# Patient Record
Sex: Female | Born: 1985
Health system: Southern US, Community
[De-identification: ages and names within clinical notes are randomized; demographics above are authoritative.]

## PROBLEM LIST (undated history)

## (undated) DIAGNOSIS — D649 Anemia, unspecified: Secondary | ICD-10-CM

## (undated) DIAGNOSIS — K219 Gastro-esophageal reflux disease without esophagitis: Secondary | ICD-10-CM

## (undated) DIAGNOSIS — I10 Essential (primary) hypertension: Secondary | ICD-10-CM

## (undated) HISTORY — DX: Essential (primary) hypertension: I10

## (undated) HISTORY — PX: NO PAST SURGERIES: SHX2092

---

## 2013-08-16 ENCOUNTER — Encounter (INDEPENDENT_AMBULATORY_CARE_PROVIDER_SITE_OTHER): Payer: Self-pay

## 2013-08-16 ENCOUNTER — Ambulatory Visit (INDEPENDENT_AMBULATORY_CARE_PROVIDER_SITE_OTHER): Payer: Medicaid Other | Admitting: Family Medicine

## 2013-08-16 ENCOUNTER — Encounter: Payer: Self-pay | Admitting: Family Medicine

## 2013-08-16 ENCOUNTER — Ambulatory Visit (INDEPENDENT_AMBULATORY_CARE_PROVIDER_SITE_OTHER): Payer: Medicaid Other

## 2013-08-16 VITALS — BP 100/69 | HR 71 | Ht 64.0 in | Wt 122.0 lb

## 2013-08-16 DIAGNOSIS — M25519 Pain in unspecified shoulder: Secondary | ICD-10-CM

## 2013-08-16 DIAGNOSIS — M25511 Pain in right shoulder: Secondary | ICD-10-CM

## 2013-08-16 MED ORDER — MELOXICAM 15 MG PO TABS: 15.0000 mg | ORAL_TABLET | Freq: Every day | ORAL | Status: DC

## 2013-08-16 NOTE — Progress Notes (Signed)
   Subjective:    Patient ID: Joyce Rasmussen, female    DOB: 1985/09/29, 29 y.o.   MRN: [id number]  HPI  Shoulder Pain:  Affected Shoulder:R shoulder  Duration: months  Mechanism of injury: unsure  Traumatic Etiology?:denies  Pain Location: anterolateral shoulder  Numbness or paresthesias: no Weakness:no Decreased grip strength?:no Aggravating movements: overhead movement  Alleviating factors: rest  Prior history of shoulder trauma/injury:unsure. Pain started about 1 year ago and self resolved.      Review of Systems  All other systems reviewed and are negative.       Objective:   Physical Exam  Constitutional: She is oriented to person, place, and time. She appears well-developed and well-nourished.  HENT:  Head: Normocephalic and atraumatic.  Eyes: Conjunctivae are normal. Pupils are equal, round, and reactive to light.  Neck: Normal range of motion.  Cardiovascular: Normal rate and regular rhythm.   Pulmonary/Chest: Effort normal and breath sounds normal.  Abdominal: Soft.  Musculoskeletal:  + mild TTP across antrerolateral shoulder  No deformity MIld pain with resisted external rotation Empty can +    Neurological: She is alert and oriented to person, place, and time.  Skin: Skin is warm.    [facility] reading (PRIMARY) by  Dr. Alvester Morin  R shoulder xray preliminarily negative for any fracture or dislocation                                         Assessment & Plan:  Pain in joint, shoulder region, right - Plan: meloxicam (MOBIC) 15 MG tablet, DG Shoulder Right  Exam and history consistent with rotator cuff injury Xrays preliminarily negative for any fracture or disloaction RICE and NSAIDs Rotator cuff rehab exercise handout given Discussed general and MSK red flags.  Follow up as needed.

## 2013-08-16 NOTE — Patient Instructions (Signed)
Rotator Cuff Injury The rotator cuff is the collective set of muscles and tendons that make up the stabilizing unit of your shoulder. This unit holds in the ball of the humerus (upper arm bone) in the socket of the scapula (shoulder blade). Injuries to this stabilizing unit most commonly come from sports or activities that cause the arm to be moved repeatedly over the head. Examples of this include throwing, weight lifting, swimming, racquet sports, or an injury such as falling on your arm. Chronic (longstanding) irritation of this unit can cause inflammation (soreness), bursitis, and eventual damage to the tendons to the point of rupture (tear). An acute (sudden) injury of the rotator cuff can result in a partial or complete tear. You may need surgery with complete tears. Small or partial rotator cuff tears may be treated conservatively with temporary immobilization, exercises and rest. Physical therapy may be needed. HOME CARE INSTRUCTIONS   Apply ice to the injury for 15-20 minutes 03-04 times per day for the first 2 days. Put the ice in a plastic bag and place a towel between the bag of ice and your skin.  If you have a shoulder immobilizer (sling and straps), do not remove it for as long as directed by your caregiver or until you see a caregiver for a follow-up examination. If you need to remove it, move your arm as little as possible.  You may want to sleep on several pillows or in a recliner at night to lessen swelling and pain.  Only take over-the-counter or prescription medicines for pain, discomfort, or fever as directed by your caregiver.  Do simple hand squeezing exercises with a soft rubber ball to decrease hand swelling. SEEK MEDICAL CARE IF:   Pain in your shoulder increases or new pain or numbness develops in your arm, hand, or fingers.  Your hand or fingers are colder than your other hand. SEEK IMMEDIATE MEDICAL CARE IF:   Your arm, hand, or fingers are numb or tingling.  Your  arm, hand, or fingers are increasingly swollen and painful, or turn white or blue. Document Released: 08/23/2000 Document Revised: 11/18/2011 Document Reviewed: 04/07/2013 ExitCare Patient Information 2014 [employer].  

## 2014-01-24 ENCOUNTER — Encounter: Payer: Self-pay | Admitting: Physician Assistant

## 2014-01-24 ENCOUNTER — Ambulatory Visit (INDEPENDENT_AMBULATORY_CARE_PROVIDER_SITE_OTHER): Payer: Medicaid Other | Admitting: Physician Assistant

## 2014-01-24 VITALS — BP 109/78 | HR 66 | Temp 99.0°F | Ht 64.0 in | Wt 123.0 lb

## 2014-01-24 DIAGNOSIS — L239 Allergic contact dermatitis, unspecified cause: Secondary | ICD-10-CM

## 2014-01-24 DIAGNOSIS — L259 Unspecified contact dermatitis, unspecified cause: Secondary | ICD-10-CM

## 2014-01-24 NOTE — Patient Instructions (Signed)
Poison Ivy Poison ivy is a inflammation of the skin (contact dermatitis) caused by touching the allergens on the leaves of the ivy plant following previous exposure to the plant. The rash usually appears 48 hours after exposure. The rash is usually bumps (papules) or blisters (vesicles) in a linear pattern. Depending on your own sensitivity, the rash may simply cause redness and itching, or it may also progress to blisters which may break open. These must be well cared for to prevent secondary bacterial (germ) infection, followed by scarring. Keep any open areas dry, clean, dressed, and covered with an antibacterial ointment if needed. The eyes may also get puffy. The puffiness is worst in the morning and gets better as the day progresses. This dermatitis usually heals without scarring, within 2 to 3 weeks without treatment. HOME CARE INSTRUCTIONS  Thoroughly wash with soap and water as soon as you have been exposed to poison ivy. You have about one half hour to remove the plant resin before it will cause the rash. This washing will destroy the oil or antigen on the skin that is causing, or will cause, the rash. Be sure to wash under your fingernails as any plant resin there will continue to spread the rash. Do not rub skin vigorously when washing affected area. Poison ivy cannot spread if no oil from the plant remains on your body. A rash that has progressed to weeping sores will not spread the rash unless you have not washed thoroughly. It is also important to wash any clothes you have been wearing as these may carry active allergens. The rash will return if you wear the unwashed clothing, even several days later. Avoidance of the plant in the future is the best measure. Poison ivy plant can be recognized by the number of leaves. Generally, poison ivy has three leaves with flowering branches on a single stem. Diphenhydramine may be purchased over the counter and used as needed for itching. Do not drive with  this medication if it makes you drowsy.Ask your caregiver about medication for children. SEEK MEDICAL CARE IF:  Open sores develop.  Redness spreads beyond area of rash.  You notice purulent (pus-like) discharge.  You have increased pain.  Other signs of infection develop (such as fever). Document Released: 08/23/2000 Document Revised: 11/18/2011 Document Reviewed: 07/12/2009 ExitCare Patient Information 2014 [employer].  

## 2014-01-24 NOTE — Progress Notes (Signed)
Subjective:     Patient ID: Joyce Rasmussen, female   DOB: 1986-01-08, 28 y.o.   MRN: [id number]  HPI Pt with pruritic rash since Wed She was working in the garden prior to the rash appearing Using OTC cream w/o relief  Review of Systems Denies any pain or drainage from the sites Sx worse when she get hot    Objective:   Physical Exam Diffuse erythema rash to the hands bilat and to the L side of the chin Rash mainly to the prox 3-5th digits bilat No vesicles seen No surrounding erythema or induration seen to the rash Similar rash to the L chin line No surrounding erythema, edema, or induration    Assessment:     Allergic Derm    Plan:     Nl course reviewed Cool compresses OTC Claritin and hydrocort for sx F/U prn

## 2014-02-22 ENCOUNTER — Ambulatory Visit (INDEPENDENT_AMBULATORY_CARE_PROVIDER_SITE_OTHER): Payer: Medicaid Other | Admitting: Nurse Practitioner

## 2014-02-22 ENCOUNTER — Encounter: Payer: Self-pay | Admitting: Nurse Practitioner

## 2014-02-22 VITALS — BP 98/72 | HR 82 | Temp 97.7°F | Ht 63.0 in | Wt 123.0 lb

## 2014-02-22 DIAGNOSIS — H918X9 Other specified hearing loss, unspecified ear: Secondary | ICD-10-CM

## 2014-02-22 DIAGNOSIS — H6120 Impacted cerumen, unspecified ear: Secondary | ICD-10-CM

## 2014-02-22 NOTE — Patient Instructions (Signed)
Cerumen Impaction A cerumen impaction is when the wax in your ear forms a plug. This plug usually causes reduced hearing. Sometimes it also causes an earache or dizziness. Removing a cerumen impaction can be difficult and painful. The wax sticks to the ear canal. The canal is sensitive and bleeds easily. If you try to remove a heavy wax buildup with a cotton tipped swab, you may push it in further. Irrigation with water, suction, and small ear curettes may be used to clear out the wax. If the impaction is fixed to the skin in the ear canal, ear drops may be needed for a few days to loosen the wax. People who build up a lot of wax frequently can use ear wax removal products available in your local drugstore. SEEK MEDICAL CARE IF:  You develop an earache, increased hearing loss, or marked dizziness. Document Released: 10/03/2004 Document Revised: 11/18/2011 Document Reviewed: 11/23/2009 ExitCare Patient Information 2014 [employer].  

## 2014-02-22 NOTE — Progress Notes (Signed)
   Subjective:    Patient ID: Joyce Rasmussen, female    DOB: June 19, 1986, 28 y.o.   MRN: [id number]  HPI Patient in C/o right ear pain and decreased hearing.- Started 2 weeks ago. No drianage    Review of Systems  Constitutional: Negative.   HENT: Positive for ear pain.   Respiratory: Negative.   Cardiovascular: Negative.   Genitourinary: Negative.   Neurological: Negative.   Psychiatric/Behavioral: Negative.   All other systems reviewed and are negative.      Objective:   Physical Exam  Constitutional: She is oriented to person, place, and time. She appears well-developed and well-nourished.  HENT:  Right Ear: Hearing, tympanic membrane and external ear normal.  Left Ear: Hearing, tympanic membrane, external ear and ear canal normal.  Right cerumen impaction   Cardiovascular: Normal rate, regular rhythm and normal heart sounds.   Pulmonary/Chest: Effort normal and breath sounds normal.  Neurological: She is alert and oriented to person, place, and time.  Skin: Skin is warm and dry.  Psychiatric: She has a normal mood and affect. Her behavior is normal. Judgment and thought content normal.   BP 98/72  Pulse 82  Temp(Src) 97.7 F (36.5 C) (Oral)  Ht 5\' 3"  (1.6 m)  Wt 123 lb (55.792 kg)  BMI 21.79 kg/m2  LMP 01/17/2014        Assessment & Plan:   1. Hearing loss secondary to cerumen impaction    Debrox OTC 2-3X a week RTO prn  Mary-Margaret Daphine DeutscherMartin, FNP

## 2014-04-01 ENCOUNTER — Encounter: Payer: Self-pay | Admitting: Family Medicine

## 2014-04-01 ENCOUNTER — Ambulatory Visit (INDEPENDENT_AMBULATORY_CARE_PROVIDER_SITE_OTHER): Payer: Medicaid Other | Admitting: Family Medicine

## 2014-04-01 VITALS — BP 104/71 | HR 67 | Temp 98.9°F | Ht 64.0 in | Wt 123.0 lb

## 2014-04-01 DIAGNOSIS — M25511 Pain in right shoulder: Secondary | ICD-10-CM

## 2014-04-01 DIAGNOSIS — M25519 Pain in unspecified shoulder: Secondary | ICD-10-CM

## 2014-04-01 MED ORDER — CYCLOBENZAPRINE HCL 5 MG PO TABS: 5.0000 mg | ORAL_TABLET | Freq: Three times a day (TID) | ORAL | Status: DC | PRN

## 2014-04-01 MED ORDER — IBUPROFEN 600 MG PO TABS: 600.0000 mg | ORAL_TABLET | Freq: Three times a day (TID) | ORAL | Status: DC | PRN

## 2014-04-01 NOTE — Progress Notes (Signed)
   Subjective:    Patient ID: Joyce Rasmussen, female    DOB: 23-Jan-1986, 28 y.o.   MRN: [id number]  HPI This 28 y.o. female presents for evaluation of right shoulder pain after doing job requiring repetitive circular movement and circumduction movement of right shoulder.  She has had this happen in the past and had to quit working.  She states she feels better in the am but as soon as she starts back to Work she gets pain in her right shoulder..   Review of Systems C/o right shoulder pain No chest pain, SOB, HA, dizziness, vision change, N/V, diarrhea, constipation, dysuria, urinary urgency or frequency or rash.     Objective:   Physical Exam Vital signs noted  Well developed well nourished female.  HEENT - Head atraumatic Normocephalic Respiratory - Lungs CTA bilateral Cardiac - RRR S1 and S2 without murmur MS - Full ROM right shoulder and TTP right trapezius muscle and right trapezius muscle spasm       Assessment & Plan:  Right shoulder pain - Plan: ibuprofen (ADVIL,MOTRIN) 600 MG tablet, cyclobenzaprine (FLEXERIL) 5 MG tablet, Ambulatory referral to Physical Therapy  Deatra CanterWilliam J Jameca Chumley FNP

## 2014-05-30 ENCOUNTER — Ambulatory Visit (INDEPENDENT_AMBULATORY_CARE_PROVIDER_SITE_OTHER): Payer: Medicaid Other | Admitting: Family Medicine

## 2014-05-30 ENCOUNTER — Encounter: Payer: Self-pay | Admitting: Family Medicine

## 2014-05-30 VITALS — BP 124/81 | HR 62 | Temp 97.8°F | Ht 63.0 in | Wt 124.2 lb

## 2014-05-30 DIAGNOSIS — L259 Unspecified contact dermatitis, unspecified cause: Secondary | ICD-10-CM

## 2014-05-30 MED ORDER — METHYLPREDNISOLONE ACETATE 80 MG/ML IJ SUSP
80.0000 mg | Freq: Once | INTRAMUSCULAR | Status: AC
  Administered 2014-05-30: 80 mg via INTRAMUSCULAR

## 2014-05-30 MED ORDER — METHYLPREDNISOLONE (PAK) 4 MG PO TABS: ORAL_TABLET | ORAL | Status: DC

## 2014-05-30 MED ORDER — HYDROXYZINE HCL 25 MG PO TABS: 25.0000 mg | ORAL_TABLET | Freq: Three times a day (TID) | ORAL | Status: DC | PRN

## 2014-05-30 NOTE — Progress Notes (Signed)
   Subjective:    Patient ID: Joyce Rasmussen, female    DOB: 1986/07/28, 28 y.o.   MRN: [id number]  HPI This 28 y.o. female presents for evaluation of rash across her chest, neck, and arms.   Review of Systems C/o rash No chest pain, SOB, HA, dizziness, vision change, N/V, diarrhea, constipation, dysuria, urinary urgency or frequency, myalgias, arthralgias.     Objective:   Physical Exam  Vital signs noted  Well developed well nourished female.  HEENT - Head atraumatic Normocephalic Respiratory - Lungs CTA bilateral Cardiac - RRR S1 and S2 without murmur GI - Abdomen soft Nontender and bowel sounds active x 4 Skin - Erythematous rash across her chest and down her arms     Assessment & Plan:  Contact dermatitis - Plan: methylPREDNIsolone (MEDROL DOSPACK) 4 MG tablet, methylPREDNISolone acetate (DEPO-MEDROL) injection 80 mg, hydrOXYzine (ATARAX/VISTARIL) 25 MG tablet  Deatra Canter FNP

## 2015-07-07 ENCOUNTER — Ambulatory Visit (INDEPENDENT_AMBULATORY_CARE_PROVIDER_SITE_OTHER): Payer: BLUE CROSS/BLUE SHIELD | Admitting: Nurse Practitioner

## 2015-07-07 ENCOUNTER — Encounter: Payer: Self-pay | Admitting: Nurse Practitioner

## 2015-07-07 ENCOUNTER — Other Ambulatory Visit: Payer: Self-pay | Admitting: Family Medicine

## 2015-07-07 VITALS — BP 112/77 | HR 66 | Temp 98.6°F | Ht 63.0 in | Wt 122.0 lb

## 2015-07-07 DIAGNOSIS — M25511 Pain in right shoulder: Secondary | ICD-10-CM

## 2015-07-07 DIAGNOSIS — M25562 Pain in left knee: Secondary | ICD-10-CM | POA: Diagnosis not present

## 2015-07-07 MED ORDER — PREDNISONE 10 MG (21) PO TBPK: 10.0000 mg | ORAL_TABLET | Freq: Every day | ORAL | Status: DC

## 2015-07-07 NOTE — Patient Instructions (Signed)
Shoulder Pain The shoulder is the joint that connects your arms to your body. The bones that form the shoulder joint include the upper arm bone (humerus), the shoulder blade (scapula), and the collarbone (clavicle). The top of the humerus is shaped like a ball and fits into a rather flat socket on the scapula (glenoid cavity). A combination of muscles and strong, fibrous tissues that connect muscles to bones (tendons) support your shoulder joint and hold the ball in the socket. Small, fluid-filled sacs (bursae) are located in different areas of the joint. They act as cushions between the bones and the overlying soft tissues and help reduce friction between the gliding tendons and the bone as you move your arm. Your shoulder joint allows a wide range of motion in your arm. This range of motion allows you to do things like scratch your back or throw a ball. However, this range of motion also makes your shoulder more prone to pain from overuse and injury. Causes of shoulder pain can originate from both injury and overuse and usually can be grouped in the following four categories:  Redness, swelling, and pain (inflammation) of the tendon (tendinitis) or the bursae (bursitis).  Instability, such as a dislocation of the joint.  Inflammation of the joint (arthritis).  Broken bone (fracture). HOME CARE INSTRUCTIONS   Apply ice to the sore area.  Put ice in a plastic bag.  Place a towel between your skin and the bag.  Leave the ice on for 15-20 minutes, 3-4 times per day for the first 2 days, or as directed by your health care provider.  Stop using cold packs if they do not help with the pain.  If you have a shoulder sling or immobilizer, wear it as long as your caregiver instructs. Only remove it to shower or bathe. Move your arm as little as possible, but keep your hand moving to prevent swelling.  Squeeze a soft ball or foam pad as much as possible to help prevent swelling.  Only take  over-the-counter or prescription medicines for pain, discomfort, or fever as directed by your caregiver. SEEK MEDICAL CARE IF:   Your shoulder pain increases, or new pain develops in your arm, hand, or fingers.  Your hand or fingers become cold and numb.  Your pain is not relieved with medicines. SEEK IMMEDIATE MEDICAL CARE IF:   Your arm, hand, or fingers are numb or tingling.  Your arm, hand, or fingers are significantly swollen or turn white or blue. MAKE SURE YOU:   Understand these instructions.  Will watch your condition.  Will get help right away if you are not doing well or get worse.   This information is not intended to replace advice given to you by your health care provider. Make sure you discuss any questions you have with your health care provider.   Document Released: 06/05/2005 Document Revised: 09/16/2014 Document Reviewed: 12/19/2014 Elsevier Interactive Patient Education 2016 [employer] Knee Pain Knee pain is a very common symptom and can have many causes. Knee pain often goes away when you follow your health care provider's instructions for relieving pain and discomfort at home. However, knee pain can develop into a condition that needs treatment. Some conditions may include:  Arthritis caused by wear and tear (osteoarthritis).  Arthritis caused by swelling and irritation (rheumatoid arthritis or gout).  A cyst or growth in your knee.  An infection in your knee joint.  An injury that will not heal.  Damage, swelling, or irritation  of the tissues that support your knee (torn ligaments or tendinitis). If your knee pain continues, additional tests may be ordered to diagnose your condition. Tests may include X-rays or other imaging studies of your knee. You may also need to have fluid removed from your knee. Treatment for ongoing knee pain depends on the cause, but treatment may include:  Medicines to relieve pain or swelling.  Steroid injections in your  knee.  Physical therapy.  Surgery. HOME CARE INSTRUCTIONS  Take medicines only as directed by your health care provider.  Rest your knee and keep it raised (elevated) while you are resting.  Do not do things that cause or worsen pain.  Avoid high-impact activities or exercises, such as running, jumping rope, or doing jumping jacks.  Apply ice to the knee area:  Put ice in a plastic bag.  Place a towel between your skin and the bag.  Leave the ice on for 20 minutes, 2-3 times a day.  Ask your health care provider if you should wear an elastic knee support.  Keep a pillow under your knee when you sleep.  Lose weight if you are overweight. Extra weight can put pressure on your knee.  Do not use any tobacco products, including cigarettes, chewing tobacco, or electronic cigarettes. If you need help quitting, ask your health care provider. Smoking may slow the healing of any bone and joint problems that you may have. SEEK MEDICAL CARE IF:  Your knee pain continues, changes, or gets worse.  You have a fever along with knee pain.  Your knee buckles or locks up.  Your knee becomes more swollen. SEEK IMMEDIATE MEDICAL CARE IF:   Your knee joint feels hot to the touch.  You have chest pain or trouble breathing.   This information is not intended to replace advice given to you by your health care provider. Make sure you discuss any questions you have with your health care provider.   Document Released: 06/23/2007 Document Revised: 09/16/2014 Document Reviewed: 04/11/2014 Elsevier Interactive Patient Education [employer]

## 2015-07-07 NOTE — Progress Notes (Signed)
   Subjective:    Patient ID: Joyce Rasmussen, female    DOB: [birth year], 29 y.o.   MRN: [id number]  HPI Patient in today c/o: Left knee pain- has been hurting for over a year- pain radiates up into hip area. Rates pain 6/10- lyimg down with pillow between legs-worse when lays on that side and walk. Has taken aleve which helps a little. Right shoulder pain- over 3 years ago- was seen last year with same complaint and was given muscle relaxor and ibuprofen. Rates pain 8/10- lifting things at work- raising arm up in air eases pain but makes hand go to sleep.    Review of Systems  Constitutional: Negative.   HENT: Negative.   Respiratory: Negative.   Cardiovascular: Negative.   Genitourinary: Negative.   Neurological: Negative.   Psychiatric/Behavioral: Negative.   All other systems reviewed and are negative.      Objective:   Physical Exam  Constitutional: She appears well-developed and well-nourished.  Cardiovascular: Normal rate, regular rhythm and normal heart sounds.   Pulmonary/Chest: Effort normal and breath sounds normal.  Musculoskeletal:  Mild left knee effusion- FROM without pain- no patella tenderness- All ligaments intact FROM of right shoulder with pain on internal rotation- no point tenderness- motor strength and sensation distally intact- grips equal bil  Skin: Skin is warm.  Psychiatric: She has a normal mood and affect. Her behavior is normal. Judgment and thought content normal.    BP 112/77 mmHg  Pulse 66  Temp(Src) 98.6 F (37 C) (Oral)  Ht 5\' 3"  (1.6 m)  Wt 122 lb (55.339 kg)  BMI 21.62 kg/m2       Assessment & Plan:   1. Left knee pain   2. Right shoulder pain    Meds ordered this encounter  Medications  . predniSONE (STERAPRED UNI-PAK 21 TAB) 10 MG (21) TBPK tablet    Sig: Take 1 tablet (10 mg total) by mouth daily. As directed x 6 days    Dispense:  21 tablet    Refill:  0    Order Specific Question:  Supervising Provider    Answer:  Ernestina PennaMOORE,  DONALD W [[id number]]   Orders Placed This Encounter  Procedures  . Arthritis Panel  . Ambulatory referral to Orthopedic Surgery    Referral Priority:  Routine    Referral Type:  Surgical    Referral Reason:  Specialty Services Required    Requested Specialty:  Orthopedic Surgery    Number of Visits Requested:  1   Moist heat to knee Rest both knee and shoulder Wrap knee when working RTO prn  Mary-Margaret Daphine DeutscherMartin, FNP

## 2015-07-08 LAB — ARTHRITIS PANEL
Basophils Absolute: 0.1 10*3/uL (ref 0.0–0.2)
Basos: 1 %
EOS (ABSOLUTE): 0.2 10*3/uL (ref 0.0–0.4)
EOS: 4 %
HEMOGLOBIN: 13.5 g/dL (ref 11.1–15.9)
Hematocrit: 40.9 % (ref 34.0–46.6)
IMMATURE GRANS (ABS): 0 10*3/uL (ref 0.0–0.1)
Immature Granulocytes: 0 %
LYMPHS: 31 %
Lymphocytes Absolute: 1.8 10*3/uL (ref 0.7–3.1)
MCH: 28.6 pg (ref 26.6–33.0)
MCHC: 33 g/dL (ref 31.5–35.7)
MCV: 87 fL (ref 79–97)
MONOCYTES: 9 %
Monocytes Absolute: 0.5 10*3/uL (ref 0.1–0.9)
NEUTROS ABS: 3.1 10*3/uL (ref 1.4–7.0)
Neutrophils: 55 %
PLATELETS: 252 10*3/uL (ref 150–379)
RBC: 4.72 x10E6/uL (ref 3.77–5.28)
RDW: 15 % (ref 12.3–15.4)
Rhuematoid fact SerPl-aCnc: <10 IU/mL (ref 0.0–13.9)
SED RATE: 3 mm/h (ref 0–32)
URIC ACID: 3.2 mg/dL (ref 2.5–7.1)
WBC: 5.8 10*3/uL (ref 3.4–10.8)

## 2015-08-14 ENCOUNTER — Telehealth: Payer: Self-pay | Admitting: Nurse Practitioner

## 2015-12-06 ENCOUNTER — Encounter: Payer: Self-pay | Admitting: Family Medicine

## 2015-12-06 ENCOUNTER — Ambulatory Visit (INDEPENDENT_AMBULATORY_CARE_PROVIDER_SITE_OTHER): Payer: BLUE CROSS/BLUE SHIELD | Admitting: Family Medicine

## 2015-12-06 ENCOUNTER — Ambulatory Visit (INDEPENDENT_AMBULATORY_CARE_PROVIDER_SITE_OTHER): Payer: BLUE CROSS/BLUE SHIELD

## 2015-12-06 VITALS — BP 103/67 | HR 85 | Temp 99.4°F | Ht 63.0 in | Wt 131.4 lb

## 2015-12-06 DIAGNOSIS — R109 Unspecified abdominal pain: Secondary | ICD-10-CM

## 2015-12-06 DIAGNOSIS — R1010 Upper abdominal pain, unspecified: Secondary | ICD-10-CM

## 2015-12-06 LAB — MICROSCOPIC EXAMINATION
Bacteria, UA: NONE SEEN
Epithelial Cells (non renal): >10 /hpf — AB (ref 0–10)
RBC, UA: NONE SEEN /hpf (ref 0–?)

## 2015-12-06 LAB — URINALYSIS, COMPLETE
Bilirubin, UA: NEGATIVE
GLUCOSE, UA: NEGATIVE
KETONES UA: NEGATIVE
LEUKOCYTES UA: NEGATIVE
Nitrite, UA: NEGATIVE
PROTEIN UA: NEGATIVE
RBC, UA: NEGATIVE
SPEC GRAV UA: 1.02 (ref 1.005–1.030)
Urobilinogen, Ur: 0.2 mg/dL (ref 0.2–1.0)
pH, UA: 5 (ref 5.0–7.5)

## 2015-12-06 MED ORDER — CEFUROXIME AXETIL 500 MG PO TABS: 500.0000 mg | ORAL_TABLET | Freq: Two times a day (BID) | ORAL | Status: DC

## 2015-12-06 MED ORDER — POLYETHYLENE GLYCOL 3350 17 GM/SCOOP PO POWD: 17.0000 g | Freq: Two times a day (BID) | ORAL | Status: DC | PRN

## 2015-12-06 NOTE — Progress Notes (Signed)
   Subjective:  Patient ID: Joyce Rasmussen, female    DOB: 10-31-1985  Age: 30 y.o. MRN: [id number]  CC: Flank Pain   HPI Joyce Rasmussen presents for 2 days of left flank pain. No fever. Mild nausea without vomiting. No diarrhea. No dysuria. Pain is a dull ache 7/10 intensity. No hx of stones. Last menses.   History Joyce Rasmussen has a past medical history of Hypertension.   She has no past surgical history on file.   Her family history includes Hyperlipidemia in her father.She reports that she has never smoked. She does not have any smokeless tobacco history on file. She reports that she does not drink alcohol or use illicit drugs.    ROS Review of Systems  Objective:  BP 103/67 mmHg  Pulse 85  Temp(Src) 99.4 F (37.4 C) (Oral)  Ht '5\' 3"'$  (1.6 m)  Wt 131 lb 6.4 oz (59.603 kg)  BMI 23.28 kg/m2  SpO2 99%  LMP 11/20/2015  BP Readings from Last 3 Encounters:  12/06/15 103/67  07/07/15 112/77  05/30/14 124/81    Wt Readings from Last 3 Encounters:  12/06/15 131 lb 6.4 oz (59.603 kg)  07/07/15 122 lb (55.339 kg)  05/30/14 124 lb 3.2 oz (56.337 kg)     Physical Exam   Lab Results  Component Value Date   WBC 5.8 07/07/2015   HCT 40.9 07/07/2015   PLT 252 07/07/2015    Patient was never admitted.  Assessment & Plan:   Joyce Rasmussen was seen today for flank pain.  Diagnoses and all orders for this visit:  Left flank pain -     Urinalysis, Complete -     DG Abd 2 Views; Future -     Amylase -     CBC with Differential/Platelet -     CMP14+EGFR -     Lipase -     Cancel: Urinalysis, Complete  Flank pain, acute -     DG Abd 2 Views; Future -     Amylase -     CBC with Differential/Platelet -     CMP14+EGFR -     Lipase -     Cancel: Urinalysis, Complete  Other orders -     cefUROXime (CEFTIN) 500 MG tablet; Take 1 tablet (500 mg total) by mouth 2 (two) times daily with a meal. -     polyethylene glycol powder (GLYCOLAX/MIRALAX) powder; Take 17 g by mouth 2 (two) times daily as  needed for moderate constipation. For constipation    XR - full of stool. UA nonspecific. Blood work pending  Constipation handout given   I have discontinued Joyce Rasmussen's predniSONE. I am also having her start on cefUROXime and polyethylene glycol powder. Additionally, I am having her maintain her ibuprofen.  Meds ordered this encounter  Medications  . cefUROXime (CEFTIN) 500 MG tablet    Sig: Take 1 tablet (500 mg total) by mouth 2 (two) times daily with a meal.    Dispense:  20 tablet    Refill:  0  . polyethylene glycol powder (GLYCOLAX/MIRALAX) powder    Sig: Take 17 g by mouth 2 (two) times daily as needed for moderate constipation. For constipation    Dispense:  3350 g    Refill:  5     Follow-up: Return if symptoms worsen or fail to improve.  Claretta Fraise, M.D.

## 2015-12-06 NOTE — Patient Instructions (Signed)

## 2015-12-07 LAB — CMP14+EGFR
ALBUMIN: 4.4 g/dL (ref 3.5–5.5)
ALK PHOS: 72 IU/L (ref 39–117)
ALT: 40 IU/L — ABNORMAL HIGH (ref 0–32)
AST: 30 IU/L (ref 0–40)
Albumin/Globulin Ratio: 1.5 (ref 1.2–2.2)
BUN / CREAT RATIO: 14 (ref 8–20)
BUN: 12 mg/dL (ref 6–20)
Bilirubin Total: 0.4 mg/dL (ref 0.0–1.2)
CALCIUM: 9.1 mg/dL (ref 8.7–10.2)
CHLORIDE: 98 mmol/L (ref 96–106)
CO2: 25 mmol/L (ref 18–29)
CREATININE: 0.86 mg/dL (ref 0.57–1.00)
GFR, EST AFRICAN AMERICAN: 104 mL/min/{1.73_m2} (ref >59)
GFR, EST NON AFRICAN AMERICAN: 90 mL/min/{1.73_m2} (ref >59)
GLOBULIN, TOTAL: 2.9 g/dL (ref 1.5–4.5)
Glucose: 64 mg/dL — ABNORMAL LOW (ref 65–99)
POTASSIUM: 4 mmol/L (ref 3.5–5.2)
SODIUM: 138 mmol/L (ref 134–144)
TOTAL PROTEIN: 7.3 g/dL (ref 6.0–8.5)

## 2015-12-07 LAB — CBC WITH DIFFERENTIAL/PLATELET
Basophils Absolute: 0.1 10*3/uL (ref 0.0–0.2)
Basos: 1 %
EOS (ABSOLUTE): 0.2 10*3/uL (ref 0.0–0.4)
EOS: 2 %
HEMOGLOBIN: 13.5 g/dL (ref 11.1–15.9)
Hematocrit: 39.9 % (ref 34.0–46.6)
IMMATURE GRANS (ABS): 0 10*3/uL (ref 0.0–0.1)
Immature Granulocytes: 0 %
LYMPHS ABS: 1.6 10*3/uL (ref 0.7–3.1)
LYMPHS: 18 %
MCH: 29.5 pg (ref 26.6–33.0)
MCHC: 33.8 g/dL (ref 31.5–35.7)
MCV: 87 fL (ref 79–97)
MONOCYTES: 10 %
Monocytes Absolute: 0.9 10*3/uL (ref 0.1–0.9)
Neutrophils Absolute: 6.1 10*3/uL (ref 1.4–7.0)
Neutrophils: 69 %
Platelets: 226 10*3/uL (ref 150–379)
RBC: 4.57 x10E6/uL (ref 3.77–5.28)
RDW: 14.9 % (ref 12.3–15.4)
WBC: 8.8 10*3/uL (ref 3.4–10.8)

## 2015-12-07 LAB — AMYLASE: Amylase: 89 U/L (ref 31–124)

## 2015-12-07 LAB — LIPASE: Lipase: 54 U/L (ref 0–59)

## 2015-12-12 ENCOUNTER — Telehealth: Payer: Self-pay | Admitting: Family Medicine

## 2015-12-12 NOTE — Telephone Encounter (Signed)
Patient aware she can take miralax unless she has watery/diarrhea stools

## 2016-07-09 ENCOUNTER — Ambulatory Visit (INDEPENDENT_AMBULATORY_CARE_PROVIDER_SITE_OTHER): Payer: BLUE CROSS/BLUE SHIELD | Admitting: Physician Assistant

## 2016-07-09 ENCOUNTER — Ambulatory Visit (INDEPENDENT_AMBULATORY_CARE_PROVIDER_SITE_OTHER): Payer: BLUE CROSS/BLUE SHIELD

## 2016-07-09 ENCOUNTER — Encounter: Payer: Self-pay | Admitting: Physician Assistant

## 2016-07-09 VITALS — BP 114/84 | HR 63 | Temp 98.6°F | Ht 63.0 in | Wt 133.2 lb

## 2016-07-09 DIAGNOSIS — M79671 Pain in right foot: Secondary | ICD-10-CM

## 2016-07-09 NOTE — Patient Instructions (Signed)

## 2016-07-10 NOTE — Progress Notes (Signed)
   BP 114/84   Pulse 63   Temp 98.6 F (37 C) (Oral)   Ht 5\' 3"  (1.6 m)   Wt 133 lb 4 oz (60.4 kg)   BMI 23.60 kg/m    Subjective:    Patient ID: Joyce CurtAna Rasmussen, female    DOB: 1985/10/10, 30 y.o.   MRN: [id number]  HPI: Joyce Rasmussen is a 30 y.o. female presenting on 07/09/2016 for Right foot pain (began one month ago after being kicked while playing soccer)  The patient states 4-5 weeks ago she was kicked in the foot while playing soccer. The other person's kick when into her right first MTP joint. She has had pain with weightbearing since then. She has been able to walk home and some running. It hurts when she gets up on her toes or has worn high heels. She has not taken much medication. She iced and rested briefly when she first hurt it. She does work a standing job all day.  Relevant past medical, surgical, family and social history reviewed and updated as indicated. Allergies and medications reviewed and updated.  Past Medical History:  Diagnosis Date  . Hypertension     History reviewed. No pertinent surgical history.  Review of Systems  Constitutional: Negative.   HENT: Negative.   Eyes: Negative.   Respiratory: Negative.   Gastrointestinal: Negative.   Genitourinary: Negative.   Musculoskeletal: Positive for arthralgias, gait problem and joint swelling.      Medication List    as of 07/09/2016 11:59 PM   You have not been prescribed any medications.        Objective:    BP 114/84   Pulse 63   Temp 98.6 F (37 C) (Oral)   Ht 5\' 3"  (1.6 m)   Wt 133 lb 4 oz (60.4 kg)   BMI 23.60 kg/m   No Known Allergies  Physical Exam  Constitutional: She is oriented to person, place, and time. She appears well-developed and well-nourished.  HENT:  Head: Normocephalic and atraumatic.  Eyes: Conjunctivae and EOM are normal. Pupils are equal, round, and reactive to light.  Cardiovascular: Normal rate, regular rhythm, normal heart sounds and intact distal pulses.     Pulmonary/Chest: Effort normal and breath sounds normal.  Musculoskeletal: She exhibits edema, tenderness and deformity.       Right foot: There is tenderness, bony tenderness and swelling. There is normal range of motion, no crepitus and no deformity.       Feet:  Neurological: She is alert and oriented to person, place, and time. She has normal reflexes.  Skin: Skin is warm and dry. No rash noted.  Psychiatric: She has a normal mood and affect. Her behavior is normal. Judgment and thought content normal.  Nursing note and vitals reviewed.       Assessment & Plan:   1. Right foot pain Ibuprofen 800 mg 3 times a day for 10 days. Ice as much as possible. No high heels for 2 more weeks. - DG Foot Complete Right; Future x-ray is preliminarily read by me and appears negative   Continue all other maintenance medications as listed above.  Follow up plan: Return if symptoms worsen or fail to improve.  Orders Placed This Encounter  Procedures  . DG Foot Complete Right    Educational handout given for contusion  Remus LofflerAngel S. Roshawn Lacina PA-C [facility] [address]  [place], [place] [address] [phone]   07/10/2016, 8:14 AM

## 2016-10-31 ENCOUNTER — Encounter: Payer: Self-pay | Admitting: Family Medicine

## 2016-10-31 ENCOUNTER — Ambulatory Visit (INDEPENDENT_AMBULATORY_CARE_PROVIDER_SITE_OTHER): Payer: BLUE CROSS/BLUE SHIELD | Admitting: Family Medicine

## 2016-10-31 VITALS — BP 89/58 | HR 63 | Temp 98.8°F | Ht 63.0 in | Wt 136.0 lb

## 2016-10-31 DIAGNOSIS — L309 Dermatitis, unspecified: Secondary | ICD-10-CM | POA: Diagnosis not present

## 2016-10-31 MED ORDER — KETOCONAZOLE 2 % EX SHAM: 1.0000 "application " | MEDICATED_SHAMPOO | CUTANEOUS | 11 refills | Status: DC

## 2016-10-31 MED ORDER — FLUOCINOLONE ACETONIDE 0.01 % EX SOLN: Freq: Two times a day (BID) | CUTANEOUS | 11 refills | Status: DC

## 2016-10-31 NOTE — Progress Notes (Signed)
   Subjective:  Patient ID: Joyce Rasmussen, female    DOB: [birth year]  Age: 31 y.o. MRN: [id number]  CC: Rash (pt here today c/o rash on the back of her head that she noticed about 3 months ago but it has gotten worse and itches.)   HPI Joyce Curtna Mendell presents for Symptoms noted above. Going on for about 3 months as noted above. She has some irritation and burning of the posterior scalp. It also seems to follow her hairline. She uses a designer shampoo with conditioner. She tends to leave it on for a couple at 3 minutes before rinsing. She washes her hair daily.   History Darien Ramusna has a past medical history of Hypertension.   She has no past surgical history on file.   Her family history includes Hyperlipidemia in her father.She reports that she has never smoked. She has never used smokeless tobacco. She reports that she does not drink alcohol or use drugs.    ROS Review of Systems   Noncontributory  Objective:  BP (!) 89/58   Pulse 63   Temp 98.8 F (37.1 C) (Oral)   Ht 5\' 3"  (1.6 m)   Wt 136 lb (61.7 kg)   LMP 10/02/2016 (Exact Date)   BMI 24.09 kg/m   BP Readings from Last 3 Encounters:  10/31/16 (!) 89/58  07/09/16 114/84  12/06/15 103/67    Wt Readings from Last 3 Encounters:  10/31/16 136 lb (61.7 kg)  07/09/16 133 lb 4 oz (60.4 kg)  12/06/15 131 lb 6.4 oz (59.6 kg)     Physical Exam  Constitutional: She is oriented to person, place, and time. She appears well-developed and well-nourished. No distress.  Cardiovascular: Normal rate and regular rhythm.   Pulmonary/Chest: Breath sounds normal.  Neurological: She is alert and oriented to person, place, and time.  Skin: Skin is warm and dry. Rash (mild erythema and 10 scale noted at the nape of the neck and at the hairline behind the left ear. Mild scale without erythema noted at the frontal hairline.) noted.  Psychiatric: She has a normal mood and affect.    Patient was never admitted.  Assessment & Plan:   Darien Ramusna was  seen today for rash.  Diagnoses and all orders for this visit:  Eczema of scalp  Other orders -     ketoconazole (NIZORAL) 2 % shampoo; Apply 1 application topically 3 (three) times a week. Shampoo in as usual, allow to stay on scalp for 5 min. Before rinsing -     fluocinolone (SYNALAR) 0.01 % external solution; Apply topically 2 (two) times daily.      I am having Ms. Donnelly start on ketoconazole and fluocinolone.  Allergies as of 10/31/2016   No Known Allergies     Medication List       Accurate as of 10/31/16  6:11 PM. Always use your most recent med list.          fluocinolone 0.01 % external solution Commonly known as:  SYNALAR Apply topically 2 (two) times daily.   ketoconazole 2 % shampoo Commonly known as:  NIZORAL Apply 1 application topically 3 (three) times a week. Shampoo in as usual, allow to stay on scalp for 5 min. Before rinsing Start taking on:  11/01/2016        Follow-up: Return in about 1 year (around 10/31/2017), or if symptoms worsen or fail to improve.  Mechele ClaudeWarren Saeed Toren, M.D.

## 2017-01-27 ENCOUNTER — Ambulatory Visit (INDEPENDENT_AMBULATORY_CARE_PROVIDER_SITE_OTHER): Payer: BLUE CROSS/BLUE SHIELD | Admitting: Pediatrics

## 2017-01-27 ENCOUNTER — Encounter: Payer: Self-pay | Admitting: Pediatrics

## 2017-01-27 VITALS — BP 109/75 | HR 67 | Temp 97.9°F | Ht 63.0 in | Wt 131.6 lb

## 2017-01-27 DIAGNOSIS — L858 Other specified epidermal thickening: Secondary | ICD-10-CM | POA: Diagnosis not present

## 2017-01-27 MED ORDER — TRIAMCINOLONE ACETONIDE 0.025 % EX OINT: 1.0000 "application " | TOPICAL_OINTMENT | Freq: Two times a day (BID) | CUTANEOUS | 2 refills | Status: DC

## 2017-01-27 NOTE — Progress Notes (Signed)
  Subjective:   Patient ID: Joyce Rasmussen, female    DOB: 08/28/1986, 31 y.o.   MRN: [id number] CC: Rash (arms, and neck)  HPI: Joyce Rasmussen is a 31 y.o. female presenting for Rash (arms, and neck)  Small bumps come up at times after being out in the sun Itching some Treated for eczema with steroid soln in the past, helped a lot Scalp now healed Uses thick moisturizer daily No pain, no fevers, otherwise feeling well There off and on for years  Relevant past medical, surgical, family and social history reviewed. Allergies and medications reviewed and updated. History  Smoking Status  . Never Smoker  Smokeless Tobacco  . Never Used   ROS: Per HPI   Objective:    BP 109/75   Pulse 67   Temp 97.9 F (36.6 C) (Oral)   Ht 5\' 3"  (1.6 m)   Wt 131 lb 9.6 oz (59.7 kg)   BMI 23.31 kg/m   Wt Readings from Last 3 Encounters:  01/27/17 131 lb 9.6 oz (59.7 kg)  10/31/16 136 lb (61.7 kg)  07/09/16 133 lb 4 oz (60.4 kg)    Gen: NAD, alert, cooperative with exam, NCAT EYES: EOMI, no conjunctival injection, or no icterus CV: NRRR, normal S1/S2, no murmur, distal pulses 2+ b/l Resp: CTABL, no wheezes, normal WOB Neuro: Alert and oriented Skin: scalp clear, upper arms b/l with small < 1 cm few scattered areas macules consistent with post-inflam hyper pigmentation. Few scattered flesh colored papules felt more than seen on upper arms  Assessment & Plan:  Joyce Rasmussen was seen today for rash.  Diagnoses and all orders for this visit:  Keratosis pilaris Cont thick moisturizer Avoid scratching -     triamcinolone (KENALOG) 0.025 % ointment; Apply 1 application topically 2 (two) times daily.  Follow up plan: As needed Joyce Krasarol Valerya Maxton, MD [facility]

## 2017-04-10 ENCOUNTER — Ambulatory Visit (INDEPENDENT_AMBULATORY_CARE_PROVIDER_SITE_OTHER): Payer: BLUE CROSS/BLUE SHIELD | Admitting: Family Medicine

## 2017-04-10 ENCOUNTER — Encounter: Payer: Self-pay | Admitting: Family Medicine

## 2017-04-10 ENCOUNTER — Ambulatory Visit (INDEPENDENT_AMBULATORY_CARE_PROVIDER_SITE_OTHER): Payer: BLUE CROSS/BLUE SHIELD

## 2017-04-10 VITALS — BP 97/64 | HR 59 | Temp 98.0°F | Ht 63.0 in | Wt 126.2 lb

## 2017-04-10 DIAGNOSIS — M25561 Pain in right knee: Secondary | ICD-10-CM | POA: Diagnosis not present

## 2017-04-10 DIAGNOSIS — M25461 Effusion, right knee: Secondary | ICD-10-CM

## 2017-04-10 MED ORDER — MELOXICAM 7.5 MG PO TABS: 7.5000 mg | ORAL_TABLET | Freq: Every day | ORAL | 0 refills | Status: DC

## 2017-04-10 NOTE — Progress Notes (Signed)
Patient ID: Joyce Rasmussen, female   DOB: November 09, 1985, 31 y.o.   MRN: [id number]  S: CC Knee injury right knee, plays soccer someone hit inside of knee 3 weeks ago, last night started hurting again at practice By history, she did not fall or twist the knee. There was some localized swelling over the medial aspect initially but from her description does not sound like swelling inside the joint.    O: Right knee again some localized medial swelling and bruising. No real joint line tenderness. The knee is stable to valgus and varus stress and anterior drawer sign is negative. There is normal quad strength.  X-ray- negative  A: Contusion and possible traumatic bursitis of the right medial knee    P: Neoprene sleeve for compression. Apply ice several times a day for as long as knee is swollen. Rx meloxicam, 7.5 mg daily for 7-10 days

## 2018-01-30 ENCOUNTER — Encounter: Payer: Self-pay | Admitting: Nurse Practitioner

## 2018-01-30 ENCOUNTER — Ambulatory Visit: Payer: BLUE CROSS/BLUE SHIELD | Admitting: Nurse Practitioner

## 2018-01-30 VITALS — BP 106/75 | HR 77 | Temp 97.3°F | Ht 63.0 in | Wt 125.0 lb

## 2018-01-30 DIAGNOSIS — L247 Irritant contact dermatitis due to plants, except food: Secondary | ICD-10-CM | POA: Diagnosis not present

## 2018-01-30 MED ORDER — METHYLPREDNISOLONE ACETATE 80 MG/ML IJ SUSP
80.0000 mg | Freq: Once | INTRAMUSCULAR | Status: AC
Start: 2018-01-30 — End: 2018-01-30
  Administered 2018-01-30: 80 mg via INTRAMUSCULAR

## 2018-01-30 MED ORDER — PREDNISONE 20 MG PO TABS: ORAL_TABLET | ORAL | 0 refills | Status: DC

## 2018-01-30 NOTE — Progress Notes (Signed)
   Subjective:    Patient ID: Joyce Rasmussen, female    DOB: 1986/07/14, 32 y.o.   MRN: [id number]   Chief Complaint: Rash (Arms, hand, chest, Started 2 weeks ago. She states that she gets it every year)   HPI Patient comes in c/o rash that started 2 weeks ago on hands ad this morning it is on both arms and neck. Very itch and has burning sensation. She has been playing softball outside. She has this yearly and usually due to contact dermatitis to plant.   Review of Systems  Constitutional: Negative.   HENT: Negative.   Respiratory: Negative.   Cardiovascular: Negative.   Genitourinary: Negative.   Skin: Positive for rash.  Neurological: Negative.   Psychiatric/Behavioral: Negative.   All other systems reviewed and are negative.      Objective:   Physical Exam  Constitutional: She appears well-developed and well-nourished. She appears distressed (mild).  Cardiovascular: Normal rate.  Pulmonary/Chest: Effort normal.  Neurological: She is alert.  Skin: Skin is warm. Rash (erythematous fine maculopapular rash  right antecubital area, left side of necck and bil hands) noted.  Psychiatric: She has a normal mood and affect. Her behavior is normal. Judgment and thought content normal.   BP 106/75   Pulse 77   Temp (!) 97.3 F (36.3 C) (Oral)   Ht  (1.6 m)   Wt 125 lb (56.7 kg)   BMI 22.14 kg/m       Assessment & Plan:  Joyce Rasmussen in today with chief complaint of Rash (Arms, hand, chest, Started 2 weeks ago. She states that she gets it every year)   1. Irritant contact dermatitis due to plants, except food Cool compresses Avoid scratching areas Calamine lotion topically Benadryl or zyrtec OTC for itching - methylPREDNISolone acetate (DEPO-MEDROL) injection 80 mg - predniSONE (DELTASONE) 20 MG tablet; 2 po at sametime daily for 5 days- start tomorrow  Dispense: 10 tablet; Refill: 0  Joyce Daphine Deutscher, FNP

## 2018-01-30 NOTE — Patient Instructions (Signed)
Poison Oak Dermatitis Poison oak dermatitis is redness and soreness (inflammation) of the skin. It is caused by a chemical that is on the leaves of the poison oak plant. You may also have itching, a rash, and blisters. Symptoms often clear up in 1-2 weeks. You may get this condition by touching a poison oak plant. You can also get it by touching something that has the chemical on it. This may include animals or objects that have come in contact with the plant. Follow these instructions at home: General instructions  Take or apply over-the-counter and prescription medicines only as told by your doctor.  If you touch poison oak, wash your skin with soap and cold water right away.  Use hydrocortisone creams or calamine lotion as needed to help with itching.  Take oatmeal baths as needed. Use colloidal oatmeal. You can get this at a pharmacy or grocery store. Follow the instructions on the package.  Do not scratch or rub your skin.  While you have the rash, wash your clothes right after you wear them. Prevention   Know what poison oak looks like so you can avoid it. This plant has three leaves with flowering branches on a single stem. The leaves are fuzzy. They have a toothlike edge.  If you have touched poison oak, wash with soap and water right away. Be sure to wash under your fingernails.  When hiking or camping, wear long pants, a long-sleeved shirt, tall socks, and hiking boots. You can also use a lotion on your skin that helps to prevent contact with the chemical on the plant.  If you think that your clothes or outdoor gear came in contact with poison oak, rinse them off with a garden hose before you bring them inside your house. Contact a doctor if:  You have open sores in the rash area.  You have more redness, swelling, or pain in the affected area.  You have redness that spreads beyond the rash area.  You have fluid, blood, or pus coming from the affected area.  You have a  fever.  You have a rash over a large area of your body.  You have a rash on your eyes, mouth, or genitals.  Your rash does not improve after a few days. Get help right away if:  Your face swells or your eyes swell shut.  You have trouble breathing.  You have trouble swallowing. This information is not intended to replace advice given to you by your health care provider. Make sure you discuss any questions you have with your health care provider. Document Released: 09/28/2010 Document Revised: 02/01/2016 Document Reviewed: 02/01/2015 Elsevier Interactive Patient Education  2018 Elsevier Inc.  

## 2018-04-17 ENCOUNTER — Encounter: Payer: Self-pay | Admitting: Physician Assistant

## 2018-04-17 ENCOUNTER — Ambulatory Visit: Payer: Commercial Managed Care - PPO | Admitting: Physician Assistant

## 2018-04-17 VITALS — BP 104/72 | HR 66 | Temp 99.2°F | Ht 63.0 in | Wt 126.4 lb

## 2018-04-17 DIAGNOSIS — L563 Solar urticaria: Secondary | ICD-10-CM

## 2018-04-17 MED ORDER — METHYLPREDNISOLONE ACETATE 80 MG/ML IJ SUSP
80.0000 mg | Freq: Once | INTRAMUSCULAR | Status: AC
  Administered 2018-04-17: 80 mg via INTRAMUSCULAR

## 2018-04-17 NOTE — Patient Instructions (Signed)
Solar urticaria  Hives Hives (urticaria) are itchy, red, swollen areas on your skin. Hives can show up on any part of your body, and they can vary in size. They can be as small as the tip of a pen or much larger. Hives often fade within 24 hours (acute hives). In other cases, new hives show up after old ones fade. This can continue for many days or weeks (chronic hives). Hives are caused by your body's reaction to an irritant or to something that you are allergic to (trigger). You can get hives right after being around a trigger or hours later. Hives do not spread from person to person (are not contagious). Hives may get worse if you scratch them, if you exercise, or if you have worries (emotional stress). Follow these instructions at home: Medicines  Take or apply over-the-counter and prescription medicines only as told by your doctor.  If you were prescribed an antibiotic medicine, use it as told by your doctor. Do not stop taking the antibiotic even if you start to feel better. Skin Care  Apply cool, wet cloths (cool compresses) to the itchy, red, swollen areas.  Do not scratch your skin. Do not rub your skin. General instructions  Do not take hot showers or baths. This can make itching worse.  Do not wear tight clothes.  Use sunscreen and wear clothing that covers your skin when you are outside.  Avoid any triggers that cause your hives. Keep a journal to help you keep track of what causes your hives. Write down: ? What medicines you take. ? What you eat and drink. ? What products you use on your skin.  Keep all follow-up visits as told by your doctor. This is important. Contact a doctor if:  Your symptoms are not better with medicine.  Your joints are painful or swollen. Get help right away if:  You have a fever.  You have belly pain.  Your tongue or lips are swollen.  Your eyelids are swollen.  Your chest or throat feels tight.  You have trouble breathing or  swallowing. These symptoms may be an emergency. Do not wait to see if the symptoms will go away. Get medical help right away. Call your local emergency services (911 in the [place] Do not drive yourself to the hospital. This information is not intended to replace advice given to you by your health care provider. Make sure you discuss any questions you have with your health care provider. Document Released: 06/04/2008 Document Revised: 02/01/2016 Document Reviewed: 06/14/2015 Elsevier Interactive Patient Education  2018 [employer]

## 2018-04-17 NOTE — Progress Notes (Signed)
     BP 104/72   Pulse 66   Temp 99.2 F (37.3 C) (Oral)   Ht 5\' 3"  (1.6 m)   Wt 126 lb 6.4 oz (57.3 kg)   BMI 22.39 kg/m    Subjective:    Patient ID: Joyce Rasmussen, female    DOB: [birth year], 32 y.o.   MRN: [id number]  HPI: Joyce Rasmussen is a 32 y.o. female presenting on 04/17/2018 for Rash This patient comes in for a rash that is occurring on her arms and legs.  She states that she gets it every summer.  She denies any exposure to any new chemicals, plants, foods or medicines.  She states this has had significant itching.  She has used some Benadryl.  The areas on her forearm have reduced.   Past Medical History:  Diagnosis Date  . Hypertension    Relevant past medical, surgical, family and social history reviewed and updated as indicated. Interim medical history since our last visit reviewed. Allergies and medications reviewed and updated. DATA REVIEWED: CHART IN EPIC  Family History reviewed for pertinent findings.  Review of Systems  Constitutional: Negative.   HENT: Negative.   Eyes: Negative.   Respiratory: Negative.   Gastrointestinal: Negative.   Genitourinary: Negative.   Skin: Positive for color change and rash.    Allergies as of 04/17/2018   No Known Allergies     Medication List        Accurate as of 04/17/18 11:03 AM. Always use your most recent med list.          DERMATOLOGICAL PRODUCTS, MISC. EX Apply topically. HPR Plus cream          Objective:    BP 104/72   Pulse 66   Temp 99.2 F (37.3 C) (Oral)   Ht 5\' 3"  (1.6 m)   Wt 126 lb 6.4 oz (57.3 kg)   BMI 22.39 kg/m   No Known Allergies  Wt Readings from Last 3 Encounters:  04/17/18 126 lb 6.4 oz (57.3 kg)  01/30/18 125 lb (56.7 kg)  04/10/17 126 lb 3.2 oz (57.2 kg)    Physical Exam  Constitutional: She is oriented to person, place, and time. She appears well-developed and well-nourished.  HENT:  Head: Normocephalic and atraumatic.  Eyes: Pupils are equal, round, and reactive to light.  Conjunctivae and EOM are normal.  Cardiovascular: Normal rate, regular rhythm, normal heart sounds and intact distal pulses.  Pulmonary/Chest: Effort normal and breath sounds normal.  Abdominal: Soft. Bowel sounds are normal.  Neurological: She is alert and oriented to person, place, and time. She has normal reflexes.  Skin: Skin is warm and dry. Rash noted. Rash is urticarial. There is erythema.     Red and warm  Psychiatric: She has a normal mood and affect. Her behavior is normal. Judgment and thought content normal.        Assessment & Plan:   1. Solar urticaria Discussed possibility of solar urticaria. Maintain history of when the rash occurs - methylPREDNISolone acetate (DEPO-MEDROL) injection 80 mg   Continue all other maintenance medications as listed above.  Follow up plan: No follow-ups on file.  Educational handout given for information concerning this is printed off for the patient  Remus LofflerAngel S. Leida Luton PA-C [facility] [address]  [place], [place] [address] [phone]   04/17/2018, 11:03 AM

## 2018-05-18 DIAGNOSIS — L249 Irritant contact dermatitis, unspecified cause: Secondary | ICD-10-CM | POA: Diagnosis not present

## 2018-05-18 DIAGNOSIS — L219 Seborrheic dermatitis, unspecified: Secondary | ICD-10-CM | POA: Diagnosis not present

## 2018-09-04 ENCOUNTER — Encounter: Payer: Self-pay | Admitting: Family

## 2018-09-04 ENCOUNTER — Ambulatory Visit: Payer: Commercial Managed Care - PPO | Admitting: Family

## 2018-09-04 VITALS — BP 93/58 | HR 75 | Temp 97.8°F | Ht 63.0 in | Wt 131.0 lb

## 2018-09-04 DIAGNOSIS — R1011 Right upper quadrant pain: Secondary | ICD-10-CM

## 2018-09-04 NOTE — Patient Instructions (Signed)
Abdominal Pain, Adult  Abdominal pain can be caused by many things. Often, abdominal pain is not serious and it gets better with no treatment or by being treated at home. However, sometimes abdominal pain is serious. Your health care provider will do a medical history and a physical exam to try to determine the cause of your abdominal pain.  Follow these instructions at home:   Take over-the-counter and prescription medicines only as told by your health care provider. Do not take a laxative unless told by your health care provider.   Drink enough fluid to keep your urine clear or pale yellow.   Watch your condition for any changes.   Keep all follow-up visits as told by your health care provider. This is important.  Contact a health care provider if:   Your abdominal pain changes or gets worse.   You are not hungry or you lose weight without trying.   You are constipated or have diarrhea for more than 2-3 days.   You have pain when you urinate or have a bowel movement.   Your abdominal pain wakes you up at night.   Your pain gets worse with meals, after eating, or with certain foods.   You are throwing up and cannot keep anything down.   You have a fever.  Get help right away if:   Your pain does not go away as soon as your health care provider told you to expect.   You cannot stop throwing up.   Your pain is only in areas of the abdomen, such as the right side or the left lower portion of the abdomen.   You have bloody or black stools, or stools that look like tar.   You have severe pain, cramping, or bloating in your abdomen.   You have signs of dehydration, such as:  ? Dark urine, very little urine, or no urine.  ? Cracked lips.  ? Dry mouth.  ? Sunken eyes.  ? Sleepiness.  ? Weakness.  This information is not intended to replace advice given to you by your health care provider. Make sure you discuss any questions you have with your health care provider.  Document Released: 06/05/2005 Document  Revised: 03/15/2016 Document Reviewed: 02/07/2016  Elsevier Interactive Patient Education  2019 Elsevier Inc.

## 2018-09-04 NOTE — Progress Notes (Signed)
Subjective:    Patient ID: Joyce Rasmussen, female    DOB: Apr 20, 1986, 32 y.o.   MRN: [id number]  Chief Complaint  Patient presents with  . right abdominal pain    Abdominal Pain  This is a new problem. The current episode started more than 1 month ago. The onset quality is gradual. The problem occurs intermittently. The problem has been unchanged (becoming more frequent). The pain is located in the RUQ. The pain is at a severity of 5/10. The pain is mild. The quality of the pain is cramping. The abdominal pain does not radiate. Associated symptoms include belching and flatus. Pertinent negatives include no constipation, diarrhea, dysuria, frequency, hematuria, nausea, vomiting or weight loss. The pain is aggravated by eating. The pain is relieved by nothing. She has tried nothing for the symptoms. The treatment provided no relief.      Review of Systems  Constitutional: Negative for weight loss.  Gastrointestinal: Positive for abdominal pain and flatus. Negative for constipation, diarrhea, nausea and vomiting.  Genitourinary: Negative for dysuria, frequency and hematuria.  All other systems reviewed and are negative.  Family History  Problem Relation Age of Onset  . Hyperlipidemia Father     Social History   Socioeconomic History  . Marital status: Single    Spouse name: Not on file  . Number of children: Not on file  . Years of education: Not on file  . Highest education level: Not on file  Occupational History  . Not on file  Social Needs  . Financial resource strain: Not on file  . Food insecurity:    Worry: Not on file    Inability: Not on file  . Transportation needs:    Medical: Not on file    Non-medical: Not on file  Tobacco Use  . Smoking status: Never Smoker  . Smokeless tobacco: Never Used  Substance and Sexual Activity  . Alcohol use: No  . Drug use: No  . Sexual activity: Not on file  Lifestyle  . Physical activity:    Days per week: Not on file   Minutes per session: Not on file  . Stress: Not on file  Relationships  . Social connections:    Talks on phone: Not on file    Gets together: Not on file    Attends religious service: Not on file    Active member of club or organization: Not on file    Attends meetings of clubs or organizations: Not on file    Relationship status: Not on file  Other Topics Concern  . Not on file  Social History Narrative  . Not on file        Objective:   Physical Exam Vitals signs reviewed.  Constitutional:      General: She is not in acute distress.    Appearance: She is well-developed.  HENT:     Head: Normocephalic and atraumatic.     Right Ear: External ear normal.  Eyes:     Pupils: Pupils are equal, round, and reactive to light.  Neck:     Musculoskeletal: Normal range of motion and neck supple.     Thyroid: No thyromegaly.  Cardiovascular:     Rate and Rhythm: Normal rate and regular rhythm.     Heart sounds: Normal heart sounds. No murmur.  Pulmonary:     Effort: Pulmonary effort is normal. No respiratory distress.     Breath sounds: Normal breath sounds. No wheezing.  Abdominal:  General: Bowel sounds are normal. There is no distension.     Palpations: Abdomen is soft.     Tenderness: There is no abdominal tenderness.  Musculoskeletal: Normal range of motion.        General: No tenderness.  Skin:    General: Skin is warm and dry.  Neurological:     Mental Status: She is alert and oriented to person, place, and time.     Cranial Nerves: No cranial nerve deficit.     Deep Tendon Reflexes: Reflexes are normal and symmetric.  Psychiatric:        Behavior: Behavior normal.        Thought Content: Thought content normal.        Judgment: Judgment normal.       BP (!) 93/58   Pulse 75   Temp 97.8 F (36.6 C) (Oral)   Ht 5' 3" (1.6 m)   Wt 131 lb (59.4 kg)   BMI 23.21 kg/m      Assessment & Plan:  Joyce Rasmussen comes in today with chief complaint of right  abdominal pain   Diagnosis and orders addressed:  1. RUQ pain -Force fluids Labs pending  Will order US to rule out gallbladder RTO if pain worsens or does not improve, red flags discussed for her to go to ED - CBC with Differential/Platelet - CMP14+EGFR - US Abdomen Limited RUQ; Future      Christy Hawks, FNP  

## 2018-09-05 ENCOUNTER — Ambulatory Visit: Payer: Commercial Managed Care - PPO

## 2018-09-05 LAB — CBC WITH DIFFERENTIAL/PLATELET
Basophils Absolute: 0.1 10*3/uL (ref 0.0–0.2)
Basos: 1 %
EOS (ABSOLUTE): 0.3 10*3/uL (ref 0.0–0.4)
Eos: 6 %
HEMOGLOBIN: 14.2 g/dL (ref 11.1–15.9)
Hematocrit: 41.2 % (ref 34.0–46.6)
Immature Grans (Abs): 0 10*3/uL (ref 0.0–0.1)
Immature Granulocytes: 0 %
LYMPHS ABS: 1.7 10*3/uL (ref 0.7–3.1)
Lymphs: 30 %
MCH: 31.9 pg (ref 26.6–33.0)
MCHC: 34.5 g/dL (ref 31.5–35.7)
MCV: 93 fL (ref 79–97)
MONOCYTES: 7 %
Monocytes Absolute: 0.4 10*3/uL (ref 0.1–0.9)
NEUTROS ABS: 3.3 10*3/uL (ref 1.4–7.0)
Neutrophils: 56 %
Platelets: 230 10*3/uL (ref 150–450)
RBC: 4.45 x10E6/uL (ref 3.77–5.28)
RDW: 12.5 % (ref 12.3–15.4)
WBC: 5.9 10*3/uL (ref 3.4–10.8)

## 2018-09-05 LAB — CMP14+EGFR
ALBUMIN: 4 g/dL (ref 3.5–5.5)
ALK PHOS: 70 IU/L (ref 39–117)
ALT: 12 IU/L (ref 0–32)
AST: 18 IU/L (ref 0–40)
Albumin/Globulin Ratio: 1.4 (ref 1.2–2.2)
BUN / CREAT RATIO: 23 (ref 9–23)
BUN: 16 mg/dL (ref 6–20)
CHLORIDE: 101 mmol/L (ref 96–106)
CO2: 22 mmol/L (ref 20–29)
Calcium: 9.5 mg/dL (ref 8.7–10.2)
Creatinine, Ser: 0.7 mg/dL (ref 0.57–1.00)
GFR calc Af Amer: 131 mL/min/{1.73_m2} (ref >59)
GFR calc non Af Amer: 113 mL/min/{1.73_m2} (ref >59)
GLOBULIN, TOTAL: 2.8 g/dL (ref 1.5–4.5)
Glucose: 83 mg/dL (ref 65–99)
Potassium: 4.4 mmol/L (ref 3.5–5.2)
Sodium: 140 mmol/L (ref 134–144)
Total Protein: 6.8 g/dL (ref 6.0–8.5)

## 2018-09-10 ENCOUNTER — Ambulatory Visit (HOSPITAL_COMMUNITY): Payer: Commercial Managed Care - PPO

## 2018-11-27 ENCOUNTER — Telehealth: Payer: Self-pay | Admitting: Nurse Practitioner

## 2018-11-27 NOTE — Telephone Encounter (Signed)
What symptoms do you have? Sore throat, fever and body ache. Fever 100.3.  How long have you been sick? Yesterday  Have you been seen for this problem? NO  If your provider decides to give you a prescription, which pharmacy would you like for it to be sent to? [facility] in [place]   Patient informed that this information will be sent to the clinical staff for review and that they should receive a follow up call.

## 2018-11-27 NOTE — Telephone Encounter (Signed)
[facility]  SWITCHBOARD  SICK CALL SCREENING   11/27/2018  Joyce Rasmussen    DGU:[id number]    DOB:29-Jul-1986  Best Contact Telephone Number: [phone]   1. Symptoms: sore throat, body aches, fever 100.3  2. Symptom Onset: Yesterday   3. Have you traveled any over the past 14 days? No  4.   Have you been in recent contact with someone that has tested positive for COVID-19? No  5.   Which pharmacy would you use today if given a prescription? [facility] [place]    Follow-Up Plan [name] was informed that this information will be given to a clinical staff member to review and that they should receive a follow-up telephone call within 24 hours. she was advised to call back if she develops any new symptoms or if her current symptoms worsen.   Screened by: Josetta Huddle, LPN

## 2018-11-27 NOTE — Telephone Encounter (Signed)
  Chief Complaint: cough and congestion  HPI Patient calls in c/o sore throat with spitting up phlegm. Had body aches last night and fever (100.3) but took tylenol and is better. This started yesterday. Denies exposure to anyone that is sick.     Your symptoms indicate a likely viral infection (Pharyngitis).   Pharyngitis is inflammation in the back of the throat which can cause a sore throat, scratchiness and sometimes difficulty swallowing.   Pharyngitis is typically caused by a respiratory virus and will just run its course.  Please keep in mind that your symptoms could last up to 10 days.  For throat pain, we recommend over the counter oral pain relief medications such as acetaminophen or aspirin, or anti-inflammatory medications such as ibuprofen or naproxen sodium.  Topical treatments such as oral throat lozenges or sprays may be used as needed.  Avoid close contact with loved ones, especially the very young and elderly.  Remember to wash your hands thoroughly throughout the day as this is the number one way to prevent the spread of infection and wipe down door knobs and counters with disinfectant.  After careful review of your answers, I would not recommend and antibiotic for your condition.  Antibiotics should not be used to treat conditions that we suspect are caused by viruses like the virus that causes the common cold or flu. However, some people can have Strep with atypical symptoms. You may need formal testing in clinic or office to confirm if your symptoms continue or worsen.  Providers prescribe antibiotics to treat infections caused by bacteria. Antibiotics are very powerful in treating bacterial infections when they are used properly.  To maintain their effectiveness, they should be used only when necessary.  Overuse of antibiotics has resulted in the development of super bugs that are resistant to treatment!    Home Care:  Only take medications as instructed by your medical  team.  Do not drink alcohol while taking these medications.  A steam or ultrasonic humidifier can help congestion.  You can place a towel over your head and breathe in the steam from hot water coming from a faucet.  Avoid close contacts especially the very young and the elderly.  Cover your mouth when you cough or sneeze.  Always remember to wash your hands.  Get Help Right Away If:  You develop worsening fever or throat pain.  You develop a severe head ache or visual changes.  Your symptoms persist after you have completed your treatment plan.  Make sure you  Understand these instructions.  Will watch your condition.  Will get help right away if you are not doing well or get worse.  The above assessment and management plan was discussed with the patient. The patient verbalized understanding of and has agreed to the management plan. Patient is aware to call the clinic if symptoms persist or worsen. Patient is aware when to return to the clinic for a follow-up visit. Patient educated on when it is appropriate to go to the emergency department.   [name], FNP

## 2019-02-12 ENCOUNTER — Encounter: Payer: Self-pay | Admitting: Family Medicine

## 2019-02-12 ENCOUNTER — Ambulatory Visit: Payer: Commercial Managed Care - PPO | Admitting: Family Medicine

## 2019-02-12 ENCOUNTER — Other Ambulatory Visit: Payer: Self-pay

## 2019-02-12 ENCOUNTER — Ambulatory Visit (INDEPENDENT_AMBULATORY_CARE_PROVIDER_SITE_OTHER): Payer: Commercial Managed Care - PPO

## 2019-02-12 VITALS — BP 111/71 | HR 72 | Temp 98.2°F | Ht 63.0 in | Wt 132.0 lb

## 2019-02-12 DIAGNOSIS — M25561 Pain in right knee: Secondary | ICD-10-CM | POA: Diagnosis not present

## 2019-02-12 NOTE — Progress Notes (Signed)
BP 111/71   Pulse 72   Temp 98.2 F (36.8 C) (Oral)   Ht 5\' 3"  (1.6 m)   Wt 132 lb (59.9 kg)   BMI 23.38 kg/m    Subjective:   Patient ID: Joyce Rasmussen, female    DOB: 1986-02-07, 33 y.o.   MRN: [id number]  HPI: Joyce Rasmussen is a 33 y.o. female presenting on 02/12/2019 for Knee Pain (right- Previous injury - Patient states yesterday she was playing soccer and now it hurts)   HPI Right knee pain Patient comes in complaining of right knee pain.  She has previously injured this knee but had not had any problems with it for some years.  She injured it yesterday while she was playing soccer and she came and tried to kick the ball and somebody kicked it while she was kicking it and it twisted her knee to the outside.  She denies it giving way or popping or catching and says it hurts more on the inside under the kneecap.  Relevant past medical, surgical, family and social history reviewed and updated as indicated. Interim medical history since our last visit reviewed. Allergies and medications reviewed and updated.  Review of Systems  Constitutional: Negative for chills and fever.  Eyes: Negative for visual disturbance.  Respiratory: Negative for chest tightness and shortness of breath.   Cardiovascular: Negative for chest pain and leg swelling.  Musculoskeletal: Positive for arthralgias. Negative for back pain and gait problem.  All other systems reviewed and are negative.   Per HPI unless specifically indicated above   Allergies as of 02/12/2019   No Known Allergies     Medication List       Accurate as of February 12, 2019 12:36 PM. If you have any questions, ask your nurse or doctor.        STOP taking these medications   DERMATOLOGICAL PRODUCTS, MISC. EX Stopped by:  Nils Pyle, MD        Objective:   BP 111/71   Pulse 72   Temp 98.2 F (36.8 C) (Oral)   Ht 5\' 3"  (1.6 m)   Wt 132 lb (59.9 kg)   BMI 23.38 kg/m   Wt Readings from Last 3 Encounters:  02/12/19  132 lb (59.9 kg)  09/04/18 131 lb (59.4 kg)  04/17/18 126 lb 6.4 oz (57.3 kg)    Physical Exam Vitals signs and nursing note reviewed.  Constitutional:      General: She is not in acute distress.    Appearance: She is well-developed. She is not diaphoretic.  Eyes:     Conjunctiva/sclera: Conjunctivae normal.  Musculoskeletal: Normal range of motion.     Right knee: She exhibits normal range of motion, no swelling, normal alignment, no LCL laxity, normal patellar mobility and no MCL laxity. Tenderness found. Medial joint line and lateral joint line tenderness noted.  Skin:    General: Skin is warm and dry.     Findings: No rash.  Neurological:     Mental Status: She is alert.     Right knee x-ray: No bony abnormalities seen, mild medial compartmental narrowing on the right, could be indicative of a meniscal injury, await final read from radiology  Knee injection: Consent form signed. Risk factors of bleeding and infection discussed with patient and patient is agreeable towards injection. Patient prepped with Betadine. Lateral approach towards injection used. Injected 80 mg of Depo-Medrol and 1 mL of 2% lidocaine. Patient tolerated procedure well and no side  effects from noted. Minimal to no bleeding. Simple bandage applied after.   Assessment & Plan:   Problem List Items Addressed This Visit    None    Visit Diagnoses    Right knee pain, unspecified chronicity    -  Primary   Relevant Orders   DG Knee 1-2 Views Right (Completed)      Likely meniscal injury based on symptoms, recommended 2 weeks off soccer and will do injection, if it returns may consider MRI in the future. Follow up plan: Return if symptoms worsen or fail to improve.  Counseling provided for all of the vaccine components Orders Placed This Encounter  Procedures  . DG Knee 1-2 Views Right    Arville CareJoshua Kavon Valenza, MD [facility] 02/12/2019, 12:36 PM

## 2020-06-05 ENCOUNTER — Other Ambulatory Visit: Payer: Self-pay

## 2020-06-05 ENCOUNTER — Ambulatory Visit: Payer: Commercial Managed Care - PPO | Admitting: Family Medicine

## 2020-06-05 ENCOUNTER — Encounter: Payer: Self-pay | Admitting: Family Medicine

## 2020-06-05 VITALS — BP 107/75 | HR 70 | Temp 98.1°F | Ht 63.0 in | Wt 147.5 lb

## 2020-06-05 DIAGNOSIS — R002 Palpitations: Secondary | ICD-10-CM

## 2020-06-05 DIAGNOSIS — K219 Gastro-esophageal reflux disease without esophagitis: Secondary | ICD-10-CM | POA: Diagnosis not present

## 2020-06-05 DIAGNOSIS — F410 Panic disorder [episodic paroxysmal anxiety] without agoraphobia: Secondary | ICD-10-CM

## 2020-06-05 MED ORDER — BUSPIRONE HCL 5 MG PO TABS: 5.0000 mg | ORAL_TABLET | Freq: Three times a day (TID) | ORAL | 5 refills | Status: DC | PRN

## 2020-06-05 MED ORDER — OMEPRAZOLE 20 MG PO CPDR: 20.0000 mg | DELAYED_RELEASE_CAPSULE | Freq: Every day | ORAL | 3 refills | Status: DC

## 2020-06-05 NOTE — Patient Instructions (Signed)
Generalized Anxiety Disorder, Adult Generalized anxiety disorder (GAD) is a mental health disorder. People with this condition constantly worry about everyday events. Unlike normal anxiety, worry related to GAD is not triggered by a specific event. These worries also do not fade or get better with time. GAD interferes with life functions, including relationships, work, and school. GAD can vary from mild to severe. People with severe GAD can have intense waves of anxiety with physical symptoms (panic attacks). What are the causes? The exact cause of GAD is not known. What increases the risk? This condition is more likely to develop in:  Women.  People who have a family history of anxiety disorders.  People who are very shy.  People who experience very stressful life events, such as the death of a loved one.  People who have a very stressful family environment. What are the signs or symptoms? People with GAD often worry excessively about many things in their lives, such as their health and family. They may also be overly concerned about:  Doing well at work.  Being on time.  Natural disasters.  Friendships. Physical symptoms of GAD include:  Fatigue.  Muscle tension or having muscle twitches.  Trembling or feeling shaky.  Being easily startled.  Feeling like your heart is pounding or racing.  Feeling out of breath or like you cannot take a deep breath.  Having trouble falling asleep or staying asleep.  Sweating.  Nausea, diarrhea, or irritable bowel syndrome (IBS).  Headaches.  Trouble concentrating or remembering facts.  Restlessness.  Irritability. How is this diagnosed? Your health care provider can diagnose GAD based on your symptoms and medical history. You will also have a physical exam. The health care provider will ask specific questions about your symptoms, including how severe they are, when they started, and if they come and go. Your health care  provider may ask you about your use of alcohol or drugs, including prescription medicines. Your health care provider may refer you to a mental health specialist for further evaluation. Your health care provider will do a thorough examination and may perform additional tests to rule out other possible causes of your symptoms. To be diagnosed with GAD, a person must have anxiety that:  Is out of his or her control.  Affects several different aspects of his or her life, such as work and relationships.  Causes distress that makes him or her unable to take part in normal activities.  Includes at least three physical symptoms of GAD, such as restlessness, fatigue, trouble concentrating, irritability, muscle tension, or sleep problems. Before your health care provider can confirm a diagnosis of GAD, these symptoms must be present more days than they are not, and they must last for six months or longer. How is this treated? The following therapies are usually used to treat GAD:  Medicine. Antidepressant medicine is usually prescribed for long-term daily control. Antianxiety medicines may be added in severe cases, especially when panic attacks occur.  Talk therapy (psychotherapy). Certain types of talk therapy can be helpful in treating GAD by providing support, education, and guidance. Options include: ? Cognitive behavioral therapy (CBT). People learn coping skills and techniques to ease their anxiety. They learn to identify unrealistic or negative thoughts and behaviors and to replace them with positive ones. ? Acceptance and commitment therapy (ACT). This treatment teaches people how to be mindful as a way to cope with unwanted thoughts and feelings. ? Biofeedback. This process trains you to manage your body's response (  physiological response) through breathing techniques and relaxation methods. You will work with a therapist while machines are used to monitor your physical symptoms.  Stress  management techniques. These include yoga, meditation, and exercise. A mental health specialist can help determine which treatment is best for you. Some people see improvement with one type of therapy. However, other people require a combination of therapies. Follow these instructions at home:  Take over-the-counter and prescription medicines only as told by your health care provider.  Try to maintain a normal routine.  Try to anticipate stressful situations and allow extra time to manage them.  Practice any stress management or self-calming techniques as taught by your health care provider.  Do not punish yourself for setbacks or for not making progress.  Try to recognize your accomplishments, even if they are small.  Keep all follow-up visits as told by your health care provider. This is important. Contact a health care provider if:  Your symptoms do not get better.  Your symptoms get worse.  You have signs of depression, such as: ? A persistently sad, cranky, or irritable mood. ? Loss of enjoyment in activities that used to bring you joy. ? Change in weight or eating. ? Changes in sleeping habits. ? Avoiding friends or family members. ? Loss of energy for normal tasks. ? Feelings of guilt or worthlessness. Get help right away if:  You have serious thoughts about hurting yourself or others. If you ever feel like you may hurt yourself or others, or have thoughts about taking your own life, get help right away. You can go to your nearest emergency department or call:  Your local emergency services (911 in the [place]  A suicide crisis helpline, such as the National Suicide Prevention Lifeline at 463 773 2915. This is open 24 hours a day. Summary  Generalized anxiety disorder (GAD) is a mental health disorder that involves worry that is not triggered by a specific event.  People with GAD often worry excessively about many things in their lives, such as their health and  family.  GAD may cause physical symptoms such as restlessness, trouble concentrating, sleep problems, frequent sweating, nausea, diarrhea, headaches, and trembling or muscle twitching.  A mental health specialist can help determine which treatment is best for you. Some people see improvement with one type of therapy. However, other people require a combination of therapies. This information is not intended to replace advice given to you by your health care provider. Make sure you discuss any questions you have with your health care provider. Document Revised: 08/08/2017 Document Reviewed: 07/16/2016 Elsevier Patient Education  2020 [employer] Managing Anxiety, Adult After being diagnosed with an anxiety disorder, you may be relieved to know why you have felt or behaved a certain way. You may also feel overwhelmed about the treatment ahead and what it will mean for your life. With care and support, you can manage this condition and recover from it. How to manage lifestyle changes Managing stress and anxiety  Stress is your body's reaction to life changes and events, both good and bad. Most stress will last just a few hours, but stress can be ongoing and can lead to more than just stress. Although stress can play a major role in anxiety, it is not the same as anxiety. Stress is usually caused by something external, such as a deadline, test, or competition. Stress normally passes after the triggering event has ended.  Anxiety is caused by something internal, such as imagining a terrible outcome  or worrying that something will go wrong that will devastate you. Anxiety often does not go away even after the triggering event is over, and it can become long-term (chronic) worry. It is important to understand the differences between stress and anxiety and to manage your stress effectively so that it does not lead to an anxious response. Talk with your health care provider or a counselor to learn more about  reducing anxiety and stress. He or she may suggest tension reduction techniques, such as:  Music therapy. This can include creating or listening to music that you enjoy and that inspires you.  Mindfulness-based meditation. This involves being aware of your normal breaths while not trying to control your breathing. It can be done while sitting or walking.  Centering prayer. This involves focusing on a word, phrase, or sacred image that means something to you and brings you peace.  Deep breathing. To do this, expand your stomach and inhale slowly through your nose. Hold your breath for 3-5 seconds. Then exhale slowly, letting your stomach muscles relax.  Self-talk. This involves identifying thought patterns that lead to anxiety reactions and changing those patterns.  Muscle relaxation. This involves tensing muscles and then relaxing them. Choose a tension reduction technique that suits your lifestyle and personality. These techniques take time and practice. Set aside 5-15 minutes a day to do them. Therapists can offer counseling and training in these techniques. The training to help with anxiety may be covered by some insurance plans. Other things you can do to manage stress and anxiety include:  Keeping a stress/anxiety diary. This can help you learn what triggers your reaction and then learn ways to manage your response.  Thinking about how you react to certain situations. You may not be able to control everything, but you can control your response.  Making time for activities that help you relax and not feeling guilty about spending your time in this way.  Visual imagery and yoga can help you stay calm and relax.  Medicines Medicines can help ease symptoms. Medicines for anxiety include:  Anti-anxiety drugs.  Antidepressants. Medicines are often used as a primary treatment for anxiety disorder. Medicines will be prescribed by a health care provider. When used together, medicines,  psychotherapy, and tension reduction techniques may be the most effective treatment. Relationships Relationships can play a big part in helping you recover. Try to spend more time connecting with trusted friends and family members. Consider going to couples counseling, taking family education classes, or going to family therapy. Therapy can help you and others better understand your condition. How to recognize changes in your anxiety Everyone responds differently to treatment for anxiety. Recovery from anxiety happens when symptoms decrease and stop interfering with your daily activities at home or work. This may mean that you will start to:  Have better concentration and focus. Worry will interfere less in your daily thinking.  Sleep better.  Be less irritable.  Have more energy.  Have improved memory. It is important to recognize when your condition is getting worse. Contact your health care provider if your symptoms interfere with home or work and you feel like your condition is not improving. Follow these instructions at home: Activity  Exercise. Most adults should do the following: ? Exercise for at least 150 minutes each week. The exercise should increase your heart rate and make you sweat (moderate-intensity exercise). ? Strengthening exercises at least twice a week.  Get the right amount and quality of sleep. Most adults  need 7-9 hours of sleep each night. Lifestyle   Eat a healthy diet that includes plenty of vegetables, fruits, whole grains, low-fat dairy products, and lean protein. Do not eat a lot of foods that are high in solid fats, added sugars, or salt.  Make choices that simplify your life.  Do not use any products that contain nicotine or tobacco, such as cigarettes, e-cigarettes, and chewing tobacco. If you need help quitting, ask your health care provider.  Avoid caffeine, alcohol, and certain over-the-counter cold medicines. These may make you feel worse. Ask  your pharmacist which medicines to avoid. General instructions  Take over-the-counter and prescription medicines only as told by your health care provider.  Keep all follow-up visits as told by your health care provider. This is important. Where to find support You can get help and support from these sources:  Self-help groups.  Online and [employer] organizations.  A trusted spiritual leader.  Couples counseling.  Family education classes.  Family therapy. Where to find more information You may find that joining a support group helps you deal with your anxiety. The following sources can help you locate counselors or support groups near you:  M[employer] Health [place]: www.mentalhealthamerica.net  [employer] [place] (ADAA): ProgramCam.dewww.adaa.org  [employer]ness (NAMI): www.nami.org Contact a health care provider if you:  Have a hard time staying focused or finishing daily tasks.  Spend many hours a day feeling worried about everyday life.  Become exhausted by worry.  Start to have headaches, feel tense, or have nausea.  Urinate more than normal.  Have diarrhea. Get help right away if you have:  A racing heart and shortness of breath.  Thoughts of hurting yourself or others. If you ever feel like you may hurt yourself or others, or have thoughts about taking your own life, get help right away. You can go to your nearest emergency department or call:  Your local emergency services (911 in the [place]  A suicide crisis helpline, such as the National Suicide Prevention Lifeline at 93983862261-670-860-5120. This is open 24 hours a day. Summary  Taking steps to learn and use tension reduction techniques can help calm you and help prevent triggering an anxiety reaction.  When used together, medicines, psychotherapy, and tension reduction techniques may be the most effective treatment.  Family, friends, and partners can play a big part in  helping you recover from an anxiety disorder. This information is not intended to replace advice given to you by your health care provider. Make sure you discuss any questions you have with your health care provider. Document Revised: 01/26/2019 Document Reviewed: 01/26/2019 Elsevier Patient Education  2020 Else[employer] Inc. Gastroesophageal Reflux Disease, Adult Gastroesophageal reflux (GER) happens when acid from the stomach flows up into the tube that connects the mouth and the stomach (esophagus). Normally, food travels down the esophagus and stays in the stomach to be digested. With GER, food and stomach acid sometimes move back up into the esophagus. You may have a disease called gastroesophageal reflux disease (GERD) if the reflux:  Happens often.  Causes frequent or very bad symptoms.  Causes problems such as damage to the esophagus. When this happens, the esophagus becomes sore and swollen (inflamed). Over time, GERD can make small holes (ulcers) in the lining of the esophagus. What are the causes? This condition is caused by a problem with the muscle between the esophagus and the stomach. When this muscle is weak or not normal, it does not  close properly to keep food and acid from coming back up from the stomach. The muscle can be weak because of:  Tobacco use.  Pregnancy.  Having a certain type of hernia (hiatal hernia).  Alcohol use.  Certain foods and drinks, such as coffee, chocolate, onions, and peppermint. What increases the risk? You are more likely to develop this condition if you:  Are overweight.  Have a disease that affects your connective tissue.  Use NSAID medicines. What are the signs or symptoms? Symptoms of this condition include:  Heartburn.  Difficult or painful swallowing.  The feeling of having a lump in the throat.  A bitter taste in the mouth.  Bad breath.  Having a lot of saliva.  Having an upset or bloated stomach.  Belching.  Chest  pain. Different conditions can cause chest pain. Make sure you see your doctor if you have chest pain.  Shortness of breath or noisy breathing (wheezing).  Ongoing (chronic) cough or a cough at night.  Wearing away of the surface of teeth (tooth enamel).  Weight loss. How is this treated? Treatment will depend on how bad your symptoms are. Your doctor may suggest:  Changes to your diet.  Medicine.  Surgery. Follow these instructions at home: Eating and drinking   Follow a diet as told by your doctor. You may need to avoid foods and drinks such as: ? Coffee and tea (with or without caffeine). ? Drinks that contain alcohol. ? Energy drinks and sports drinks. ? Bubbly (carbonated) drinks or sodas. ? Chocolate and cocoa. ? Peppermint and mint flavorings. ? Garlic and onions. ? Horseradish. ? Spicy and acidic foods. These include peppers, chili powder, curry powder, vinegar, hot sauces, and BBQ sauce. ? Citrus fruit juices and citrus fruits, such as oranges, lemons, and limes. ? Tomato-based foods. These include red sauce, chili, salsa, and pizza with red sauce. ? Fried and fatty foods. These include donuts, french fries, potato chips, and high-fat dressings. ? High-fat meats. These include hot dogs, rib eye steak, sausage, ham, and bacon. ? High-fat dairy items, such as whole milk, butter, and cream cheese.  Eat small meals often. Avoid eating large meals.  Avoid drinking large amounts of liquid with your meals.  Avoid eating meals during the 2-3 hours before bedtime.  Avoid lying down right after you eat.  Do not exercise right after you eat. Lifestyle   Do not use any products that contain nicotine or tobacco. These include cigarettes, e-cigarettes, and chewing tobacco. If you need help quitting, ask your doctor.  Try to lower your stress. If you need help doing this, ask your doctor.  If you are overweight, lose an amount of weight that is healthy for you. Ask your  doctor about a safe weight loss goal. General instructions  Pay attention to any changes in your symptoms.  Take over-the-counter and prescription medicines only as told by your doctor. Do not take aspirin, ibuprofen, or other NSAIDs unless your doctor says it is okay.  Wear loose clothes. Do not wear anything tight around your waist.  Raise (elevate) the head of your bed about 6 inches (15 cm).  Avoid bending over if this makes your symptoms worse.  Keep all follow-up visits as told by your doctor. This is important. Contact a doctor if:  You have new symptoms.  You lose weight and you do not know why.  You have trouble swallowing or it hurts to swallow.  You have wheezing or a cough that  keeps happening.  Your symptoms do not get better with treatment.  You have a hoarse voice. Get help right away if:  You have pain in your arms, neck, jaw, teeth, or back.  You feel sweaty, dizzy, or light-headed.  You have chest pain or shortness of breath.  You throw up (vomit) and your throw-up looks like blood or coffee grounds.  You pass out (faint).  Your poop (stool) is bloody or black.  You cannot swallow, drink, or eat. Summary  If a person has gastroesophageal reflux disease (GERD), food and stomach acid move back up into the esophagus and cause symptoms or problems such as damage to the esophagus.  Treatment will depend on how bad your symptoms are.  Follow a diet as told by your doctor.  Take all medicines only as told by your doctor. This information is not intended to replace advice given to you by your health care provider. Make sure you discuss any questions you have with your health care provider. Document Revised: 03/04/2018 Document Reviewed: 03/04/2018 Elsevier Patient Education  2020 [employer].

## 2020-06-05 NOTE — Progress Notes (Signed)
Subjective: CC: heartburn PCP: Chevis Pretty, FNP  HPI:Joyce Rasmussen is a 34 y.o. female presenting to clinic today for:  1. Heartburn Joyce Rasmussen reports heartburn for 1 year. The describes burning in her epigastric region and throat. The pain is worse after eating or laying down. She recently saw her dentist who told her she had erosion on her teeth for stomach acid. She has not tried anything for heartburn. Denies trouble swallowing, nausea, vomiting, or blood in stool.   2.  Palpitations Joyce Rasmussen reports episodes of racing heart rate, dizziness, difficulty catching a breathing, and shaking over the last 3 months. She reports the episodes last for a few minutes and then go away after deep breathing and trying to calm down. She reports this often happens at work, which is  stressful. She has also happened before soccer games when she is feeling nervous. It has occasionally happen at random times. She denies weakness, chest pain, or fatigue. She denies significant family cardiac history. She has not been diagnosed with anxiety before.   Work is stressful. Over the last 3 months. Shortness of breath, dizziness, difficulty catching a breath, shaky. For a couples of minutes, then goes away after trying to calm down. And before soccer games when she is nervous. Happens once every 2 weeks.    Relevant past medical, surgical, family, and social history reviewed and updated as indicated.  Allergies and medications reviewed and updated.  No Known Allergies Past Medical History:  Diagnosis Date  . Hypertension     Current Outpatient Medications:  Marland Kitchen  MELATONIN PO, Take by mouth., Disp: , Rfl:  Social History   Socioeconomic History  . Marital status: Single    Spouse name: Not on file  . Number of children: Not on file  . Years of education: Not on file  . Highest education level: Not on file  Occupational History  . Not on file  Tobacco Use  . Smoking status: Never Smoker  . Smokeless  tobacco: Never Used  Vaping Use  . Vaping Use: Never used  Substance and Sexual Activity  . Alcohol use: No  . Drug use: No  . Sexual activity: Not on file  Other Topics Concern  . Not on file  Social History Narrative  . Not on file   Social Determinants of Health   Financial Resource Strain:   . Difficulty of Paying Living Expenses: Not on file  Food Insecurity:   . Worried About Charity fundraiser in the Last Year: Not on file  . Ran Out of Food in the Last Year: Not on file  Transportation Needs:   . Lack of Transportation (Medical): Not on file  . Lack of Transportation (Non-Medical): Not on file  Physical Activity:   . Days of Exercise per Week: Not on file  . Minutes of Exercise per Session: Not on file  Stress:   . Feeling of Stress : Not on file  Social Connections:   . Frequency of Communication with Friends and Family: Not on file  . Frequency of Social Gatherings with Friends and Family: Not on file  . Attends Religious Services: Not on file  . Active Member of Clubs or Organizations: Not on file  . Attends Archivist Meetings: Not on file  . Marital Status: Not on file  Intimate Partner Violence:   . Fear of Current or Ex-Partner: Not on file  . Emotionally Abused: Not on file  . Physically Abused: Not on file  .  Sexually Abused: Not on file   Family History  Problem Relation Age of Onset  . Hyperlipidemia Father     Review of Systems  Per HPI.  Objective: Office vital signs reviewed. BP 107/75   Pulse 70   Temp 98.1 F (36.7 C) (Temporal)   Ht '5\' 3"'  (1.6 m)   Wt 147 lb 8 oz (66.9 kg)   BMI 26.13 kg/m     Office Visit from 06/05/2020 in [facility]  PHQ-2 Total Score 0     GAD 7 : Generalized Anxiety Score 06/05/2020  Nervous, Anxious, on Edge 3  Control/stop worrying 2  Worry too much - different things 1  Trouble relaxing 1  Restless 1  Easily annoyed or irritable 0  Afraid - awful might happen 0   Total GAD 7 Score 8  Anxiety Difficulty Somewhat difficult    Physical Examination:  Physical Exam Vitals and nursing note reviewed.  Constitutional:      General: She is not in acute distress.    Appearance: Normal appearance. She is normal weight. She is not ill-appearing, toxic-appearing or diaphoretic.  Cardiovascular:     Rate and Rhythm: Normal rate and regular rhythm.     Heart sounds: Normal heart sounds. No murmur heard.   Pulmonary:     Effort: Pulmonary effort is normal.     Breath sounds: Normal breath sounds.  Abdominal:     General: Abdomen is flat. Bowel sounds are normal. There is no distension.     Palpations: Abdomen is soft. There is no mass.     Tenderness: There is no abdominal tenderness. There is no guarding or rebound.     Hernia: No hernia is present.  Musculoskeletal:        General: Normal range of motion.     Cervical back: Normal range of motion. No rigidity or tenderness.     Right lower leg: No edema.     Left lower leg: No edema.  Lymphadenopathy:     Cervical: No cervical adenopathy.  Skin:    General: Skin is warm and dry.     Capillary Refill: Capillary refill takes less than 2 seconds.  Neurological:     General: No focal deficit present.     Mental Status: She is alert and oriented to person, place, and time.     Motor: No weakness.  Psychiatric:        Mood and Affect: Mood normal.        Behavior: Behavior normal.      Results for orders placed or performed in visit on 09/04/18  CBC with Differential/Platelet  Result Value Ref Range   WBC 5.9 3.4 - 10.8 x10E3/uL   RBC 4.45 3.77 - 5.28 x10E6/uL   Hemoglobin 14.2 11.1 - 15.9 g/dL   Hematocrit 41.2 34.0 - 46.6 %   MCV 93 79 - 97 fL   MCH 31.9 26.6 - 33.0 pg   MCHC 34.5 31 - 35 g/dL   RDW 12.5 12.3 - 15.4 %   Platelets 230 150 - 450 x10E3/uL   Neutrophils 56 Not Estab. %   Lymphs 30 Not Estab. %   Monocytes 7 Not Estab. %   Eos 6 Not Estab. %   Basos 1 Not Estab. %    Neutrophils Absolute 3.3 1 - 7 x10E3/uL   Lymphocytes Absolute 1.7 0 - 3 x10E3/uL   Monocytes Absolute 0.4 0 - 0 x10E3/uL   EOS (ABSOLUTE) 0.3 0.0 - 0.4  x10E3/uL   Basophils Absolute 0.1 0 - 0 x10E3/uL   Immature Granulocytes 0 Not Estab. %   Immature Grans (Abs) 0.0 0.0 - 0.1 x10E3/uL  CMP14+EGFR  Result Value Ref Range   Glucose 83 65 - 99 mg/dL   BUN 16 6 - 20 mg/dL   Creatinine, Ser 0.70 0.57 - 1.00 mg/dL   GFR calc non Af Amer 113 >59 mL/min/1.73   GFR calc Af Amer 131 >59 mL/min/1.73   BUN/Creatinine Ratio 23 9 - 23   Sodium 140 134 - 144 mmol/L   Potassium 4.4 3.5 - 5.2 mmol/L   Chloride 101 96 - 106 mmol/L   CO2 22 20 - 29 mmol/L   Calcium 9.5 8.7 - 10.2 mg/dL   Total Protein 6.8 6.0 - 8.5 g/dL   Albumin 4.0 3.5 - 5.5 g/dL   Globulin, Total 2.8 1.5 - 4.5 g/dL   Albumin/Globulin Ratio 1.4 1.2 - 2.2   Bilirubin Total <0.2 0.0 - 1.2 mg/dL   Alkaline Phosphatase 70 39 - 117 IU/L   AST 18 0 - 40 IU/L   ALT 12 0 - 32 IU/L     Assessment/ Plan: Joyce Rasmussen was seen today for gastroesophageal reflux.  Diagnoses and all orders for this visit:  Gastroesophageal reflux disease without esophagitis No alarm signs today. History consistent with GERD. Prilosec ordered. Avoid fried, greasy foods. Handout given. RTO  for new or worsening symptoms. Follow up in 1-2 months.  -     omeprazole (PRILOSEC) 20 MG capsule; Take 1 capsule (20 mg total) by mouth daily.  Panic attacks History consist with panic attacks. Discussed daily controller medication, but patient declined at this time. Buspar ordered prn. Stress management handout given. Discussed RTO for new or worsening symptoms, or if symptoms persist. Follow up in 1-2 months.  -     busPIRone (BUSPAR) 5 MG tablet; Take 1 tablet (5 mg total) by mouth 3 (three) times daily as needed (for anxiety).  Palpitations EKG- NSR today. Palpitations likely due to anxiety. Avoid caffeine. Follow up if new or worsening symptoms, or if symptoms persist.   -     EKG 12-Lead  The above assessment and management plan was discussed with the patient. The patient verbalized understanding of and has agreed to the management plan. Patient is aware to call the clinic if symptoms persist or worsen. Patient is aware when to return to the clinic for a follow-up visit. Patient educated on when it is appropriate to go to the emergency department.   Marjorie Smolder, FNP-C [facility] [address] [place], [place] [address] [phone]

## 2020-07-11 ENCOUNTER — Encounter: Payer: Commercial Managed Care - PPO | Admitting: Nurse Practitioner

## 2020-07-17 ENCOUNTER — Encounter: Payer: Self-pay | Admitting: *Deleted

## 2020-07-31 ENCOUNTER — Encounter: Payer: Commercial Managed Care - PPO | Admitting: Nurse Practitioner

## 2020-08-02 ENCOUNTER — Encounter: Payer: Commercial Managed Care - PPO | Admitting: Nurse Practitioner

## 2020-08-07 ENCOUNTER — Ambulatory Visit: Payer: Commercial Managed Care - PPO | Admitting: Nurse Practitioner

## 2020-08-16 ENCOUNTER — Other Ambulatory Visit: Payer: Commercial Managed Care - PPO

## 2020-08-16 DIAGNOSIS — Z20822 Contact with and (suspected) exposure to covid-19: Secondary | ICD-10-CM

## 2020-08-18 LAB — SARS-COV-2, NAA 2 DAY TAT

## 2020-08-18 LAB — NOVEL CORONAVIRUS, NAA: SARS-CoV-2, NAA: NOT DETECTED

## 2020-08-23 ENCOUNTER — Ambulatory Visit: Payer: Commercial Managed Care - PPO | Admitting: Family Medicine

## 2020-08-23 ENCOUNTER — Other Ambulatory Visit: Payer: Self-pay

## 2020-08-23 ENCOUNTER — Encounter: Payer: Self-pay | Admitting: Family Medicine

## 2020-08-23 VITALS — BP 97/62 | HR 77 | Temp 97.7°F | Ht 63.0 in | Wt 146.0 lb

## 2020-08-23 DIAGNOSIS — F410 Panic disorder [episodic paroxysmal anxiety] without agoraphobia: Secondary | ICD-10-CM

## 2020-08-23 DIAGNOSIS — R002 Palpitations: Secondary | ICD-10-CM

## 2020-08-23 DIAGNOSIS — K219 Gastro-esophageal reflux disease without esophagitis: Secondary | ICD-10-CM | POA: Diagnosis not present

## 2020-08-23 MED ORDER — OMEPRAZOLE 20 MG PO CPDR: 20.0000 mg | DELAYED_RELEASE_CAPSULE | Freq: Every day | ORAL | 2 refills | Status: DC

## 2020-08-23 MED ORDER — FAMOTIDINE 20 MG PO TABS: 20.0000 mg | ORAL_TABLET | Freq: Two times a day (BID) | ORAL | 11 refills | Status: DC | PRN

## 2020-08-23 NOTE — Progress Notes (Signed)
Subjective: CC: GERD PCP: Bennie Pierini, FNP  HPI:Joyce Rasmussen is a 34 y.o. female presenting to clinic today for:  1. GERD She has been out of omeprazole for 2 weeks. She has had more symptoms since being out of her medicine. She reports heartburn that is worse at night. She has tried Tums without relief. She reports her symptoms were pretty well controlled on omeprazole daily but she would still wake up with heartburn about 2x a week. This is relieved by taking omeprazole when this happened.  2. Panic attacks [name] denies panic attacks since her last visit. She has not had to take buspirone.  3. Palpitations She denies palpitations since her last visit.   Relevant past medical, surgical, family, and social history reviewed and updated as indicated.  Allergies and medications reviewed and updated.  No Known Allergies Past Medical History:  Diagnosis Date  . Hypertension     Current Outpatient Medications:  .  busPIRone (BUSPAR) 5 MG tablet, Take 1 tablet (5 mg total) by mouth 3 (three) times daily as needed (for anxiety)., Disp: 90 tablet, Rfl: 5 .  MELATONIN PO, Take by mouth., Disp: , Rfl:  .  omeprazole (PRILOSEC) 20 MG capsule, Take 1 capsule (20 mg total) by mouth daily., Disp: 30 capsule, Rfl: 3 Social History   Socioeconomic History  . Marital status: Single    Spouse name: Not on file  . Number of children: Not on file  . Years of education: Not on file  . Highest education level: Not on file  Occupational History  . Not on file  Tobacco Use  . Smoking status: Never Smoker  . Smokeless tobacco: Never Used  Vaping Use  . Vaping Use: Never used  Substance and Sexual Activity  . Alcohol use: No  . Drug use: No  . Sexual activity: Not on file  Other Topics Concern  . Not on file  Social History Narrative  . Not on file   Social Determinants of Health   Financial Resource Strain: Not on file  Food Insecurity: Not on file  Transportation Needs: Not on  file  Physical Activity: Not on file  Stress: Not on file  Social Connections: Not on file  Intimate Partner Violence: Not on file   Family History  Problem Relation Age of Onset  . Hyperlipidemia Father     Review of Systems  Negative unless specially indicated above in HPI.  Objective: Office vital signs reviewed. BP 97/62   Pulse 77   Temp 97.7 F (36.5 C) (Temporal)   Ht 5\' 3"  (1.6 m)   Wt 146 lb (66.2 kg)   BMI 25.86 kg/m   Physical Examination:  Physical Exam Vitals and nursing note reviewed.  Constitutional:      General: She is not in acute distress.    Appearance: Normal appearance. She is normal weight. She is not ill-appearing, toxic-appearing or diaphoretic.  Cardiovascular:     Rate and Rhythm: Normal rate and regular rhythm.     Heart sounds: Normal heart sounds. No murmur heard.   Pulmonary:     Effort: Pulmonary effort is normal. No respiratory distress.     Breath sounds: Normal breath sounds.  Abdominal:     General: Abdomen is flat. Bowel sounds are normal. There is no distension.     Palpations: Abdomen is soft.     Tenderness: There is no abdominal tenderness. There is no guarding or rebound.  Musculoskeletal:     Cervical back: Neck  supple. No tenderness.     Right lower leg: No edema.     Left lower leg: No edema.  Skin:    General: Skin is warm and dry.  Neurological:     General: No focal deficit present.     Mental Status: She is alert and oriented to person, place, and time.  Psychiatric:        Mood and Affect: Mood normal.        Behavior: Behavior normal.      Results for orders placed or performed in visit on 08/16/20  Novel Coronavirus, NAA ([facility])   Specimen: Nasopharyngeal(NP) swabs in vial transport medium   Nasopharynge  Result Value Ref Range   SARS-CoV-2, NAA Not Detected Not Detected  SARS-COV-2, NAA 2 DAY TAT   Nasopharynge  Result Value Ref Range   SARS-CoV-2, NAA 2 DAY TAT Performed      Assessment/  Plan: Izora was seen today for heartburn.  Diagnoses and all orders for this visit:  Gastroesophageal reflux disease without esophagitis Restart omeprazole. Famotidine as needed.  -     omeprazole (PRILOSEC) 20 MG capsule; Take 1 capsule (20 mg total) by mouth daily. -     famotidine (PEPCID) 20 MG tablet; Take 1 tablet (20 mg total) by mouth 2 (two) times daily as needed for heartburn or indigestion.  Panic attacks Denies since last visit.  Palpitations Denies since last visit.  Return to office for new or worsening symptoms, or if symptoms persist.   Follow up in 6 months with PCP for CPE.   The above assessment and management plan was discussed with the patient. The patient verbalized understanding of and has agreed to the management plan. Patient is aware to call the clinic if symptoms persist or worsen. Patient is aware when to return to the clinic for a follow-up visit. Patient educated on when it is appropriate to go to the emergency department.   Harlow Mares, FNP-C [facility] [address] [place], [place] [address] [phone]

## 2020-08-23 NOTE — Patient Instructions (Signed)

## 2020-09-29 ENCOUNTER — Telehealth (INDEPENDENT_AMBULATORY_CARE_PROVIDER_SITE_OTHER): Payer: Commercial Managed Care - PPO | Admitting: Family Medicine

## 2020-09-29 NOTE — Progress Notes (Signed)
Patient did not answer phone, left a voicemail. Patient did not answer video text inquiry, left a voicemail. Arville Care, MD [facility] 09/29/2020, 5:33 PM

## 2021-01-17 ENCOUNTER — Other Ambulatory Visit: Payer: Self-pay

## 2021-01-17 ENCOUNTER — Encounter: Payer: Self-pay | Admitting: Nurse Practitioner

## 2021-01-17 ENCOUNTER — Ambulatory Visit: Payer: Commercial Managed Care - PPO | Admitting: Nurse Practitioner

## 2021-01-17 DIAGNOSIS — M546 Pain in thoracic spine: Secondary | ICD-10-CM | POA: Diagnosis not present

## 2021-01-17 MED ORDER — IBUPROFEN 600 MG PO TABS: 600.0000 mg | ORAL_TABLET | Freq: Three times a day (TID) | ORAL | 0 refills | Status: DC | PRN
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
## 2021-02-21 ENCOUNTER — Encounter: Payer: Commercial Managed Care - PPO | Admitting: Nurse Practitioner

## 2021-02-26 ENCOUNTER — Encounter: Payer: Self-pay | Admitting: Nurse Practitioner

## 2021-04-26 ENCOUNTER — Telehealth: Payer: Self-pay | Admitting: Nurse Practitioner

## 2021-04-26 DIAGNOSIS — K219 Gastro-esophageal reflux disease without esophagitis: Secondary | ICD-10-CM

## 2021-04-26 MED ORDER — OMEPRAZOLE 20 MG PO CPDR: 20.0000 mg | DELAYED_RELEASE_CAPSULE | Freq: Every day | ORAL | 1 refills | Status: DC

## 2021-04-26 NOTE — Telephone Encounter (Signed)
That is ok. Ok to refill.

## 2021-04-26 NOTE — Telephone Encounter (Signed)
Patient has been seeing you not mmm want me to change pcp to you and is it okay for refill?

## 2021-04-26 NOTE — Telephone Encounter (Signed)
MMM, is this okay with you?  Refill was sent.

## 2021-04-26 NOTE — Telephone Encounter (Signed)
  Prescription Request  04/26/2021  What is the name of the medication or equipment? omeprazole (PRILOSEC) 20 MG capsule  Have you contacted your pharmacy to request a refill? (if applicable) yes  Which pharmacy would you like this sent to? [facility]    Patient notified that their request is being sent to the clinical staff for review and that they should receive a response within 2 business days.

## 2021-04-26 NOTE — Addendum Note (Signed)
Addended by: Quay Burow on: 04/26/2021 04:20 PM   Modules accepted: Orders

## 2021-11-06 ENCOUNTER — Encounter: Payer: Self-pay | Admitting: Family Medicine

## 2021-11-06 ENCOUNTER — Ambulatory Visit (INDEPENDENT_AMBULATORY_CARE_PROVIDER_SITE_OTHER): Payer: Commercial Managed Care - PPO

## 2021-11-06 ENCOUNTER — Ambulatory Visit: Payer: Commercial Managed Care - PPO | Admitting: Family Medicine

## 2021-11-06 VITALS — BP 112/78 | HR 64 | Temp 98.7°F | Ht 63.0 in | Wt 153.4 lb

## 2021-11-06 DIAGNOSIS — M79671 Pain in right foot: Secondary | ICD-10-CM

## 2021-11-06 MED ORDER — NAPROXEN 500 MG PO TABS: 500.0000 mg | ORAL_TABLET | Freq: Two times a day (BID) | ORAL | 0 refills | Status: DC

## 2021-11-06 NOTE — Progress Notes (Signed)
Assessment & Plan:  1. Right foot pain Education provided on foot pain. Continue salt water soaks. Encouraged to take Naproxen for pain and to reduce inflammation. Advised not to return to soccer until foot is feeling better.  - DG Foot Complete Right; Future - naproxen (NAPROSYN) 500 MG tablet; Take 1 tablet (500 mg total) by mouth 2 (two) times daily with a meal.  Dispense: 60 tablet; Refill: 0   Follow up plan: Return if symptoms worsen or fail to improve.  Deliah Boston, MSN, APRN, FNP-C [facility]  Subjective:   Patient ID: Joyce Rasmussen, female    DOB: 04/03/1986, 36 y.o.   MRN: [id number]  HPI: Joyce Rasmussen is a 36 y.o. female presenting on 11/06/2021 for Foot Pain (Right foot pain after a injury playing soccer on Sunday )  Patient reports her right foot started hurting two days ago after a soccer injury. She has been soaking her foot in salt water, which has been helpful.   ROS: Negative unless specifically indicated above in HPI.   Relevant past medical history reviewed and updated as indicated.   Allergies and medications reviewed and updated.   Current Outpatient Medications:    busPIRone (BUSPAR) 5 MG tablet, Take 1 tablet (5 mg total) by mouth 3 (three) times daily as needed (for anxiety). (Patient not taking: Reported on 01/17/2021), Disp: 90 tablet, Rfl: 5   cyclobenzaprine (FLEXERIL) 10 MG tablet, Take 1 tablet by mouth 2 (two) times daily. (Patient not taking: Reported on 11/06/2021), Disp: , Rfl:    ibuprofen (ADVIL) 600 MG tablet, Take 1 tablet (600 mg total) by mouth every 8 (eight) hours as needed. (Patient not taking: Reported on 11/06/2021), Disp: 30 tablet, Rfl: 0   MELATONIN PO, Take by mouth. (Patient not taking: Reported on 01/17/2021), Disp: , Rfl:    omeprazole (PRILOSEC) 20 MG capsule, Take 1 capsule (20 mg total) by mouth daily. (Patient not taking: Reported on 11/06/2021), Disp: 90 capsule, Rfl: 1  No Known Allergies  Objective:    BP 112/78    Pulse 64    Temp 98.7 F (37.1 C) (Temporal)    Ht 5\' 3"  (1.6 m)    Wt 153 lb 6.4 oz (69.6 kg)    BMI 27.17 kg/m    Physical Exam Vitals reviewed.  Constitutional:      General: She is not in acute distress.    Appearance: Normal appearance. She is not ill-appearing, toxic-appearing or diaphoretic.  HENT:     Head: Normocephalic and atraumatic.  Eyes:     General: No scleral icterus.       Right eye: No discharge.        Left eye: No discharge.     Conjunctiva/sclera: Conjunctivae normal.  Cardiovascular:     Rate and Rhythm: Normal rate.  Pulmonary:     Effort: Pulmonary effort is normal. No respiratory distress.  Musculoskeletal:        General: Normal range of motion.     Cervical back: Normal range of motion.     Right foot: Normal range of motion and normal capillary refill. Swelling (MTP joint) and bony tenderness present. No deformity or laceration. Normal pulse.  Skin:    General: Skin is warm and dry.     Capillary Refill: Capillary refill takes less than 2 seconds.  Neurological:     General: No focal deficit present.     Mental Status: She is alert and oriented to person, place, and time. Mental  status is at baseline.  Psychiatric:        Mood and Affect: Mood normal.        Behavior: Behavior normal.        Thought Content: Thought content normal.        Judgment: Judgment normal.

## 2022-01-22 ENCOUNTER — Ambulatory Visit: Payer: Commercial Managed Care - PPO | Admitting: Family Medicine

## 2022-08-20 ENCOUNTER — Ambulatory Visit (INDEPENDENT_AMBULATORY_CARE_PROVIDER_SITE_OTHER): Payer: Commercial Managed Care - PPO

## 2022-08-20 ENCOUNTER — Ambulatory Visit: Payer: Commercial Managed Care - PPO | Admitting: Nurse Practitioner

## 2022-08-20 ENCOUNTER — Encounter: Payer: Self-pay | Admitting: Nurse Practitioner

## 2022-08-20 VITALS — BP 104/71 | HR 76 | Temp 98.0°F | Resp 20 | Ht 63.0 in | Wt 153.0 lb

## 2022-08-20 DIAGNOSIS — S82891A Other fracture of right lower leg, initial encounter for closed fracture: Secondary | ICD-10-CM

## 2022-08-20 DIAGNOSIS — M25571 Pain in right ankle and joints of right foot: Secondary | ICD-10-CM | POA: Diagnosis not present

## 2022-08-20 NOTE — Progress Notes (Signed)
Subjective:    Patient ID: Joyce Rasmussen, female    DOB: Feb 16, 1986, 36 y.o.   MRN: [id number]   Chief Complaint: Ankle Pain (Right ankle. Hurt while playing soccer on Saturday)   Ankle Pain  The incident occurred 3 to 5 days ago (Saturday 08/17/22). Incident location: indoor sports complex. The injury mechanism was an inversion injury (playing soccer; stepped on ball with left foot; caught herself on her R foot and foot bent inward and she heard a loud crack). The pain is present in the right ankle. The quality of the pain is described as aching. The pain is at a severity of 9/10. The pain has been Constant since onset. Associated symptoms include numbness. Pertinent negatives include no inability to bear weight or loss of sensation. Associated symptoms comments: Top of foot numb at times. She reports no foreign bodies present. The symptoms are aggravated by weight bearing, movement and palpation. She has tried NSAIDs, elevation, non-weight bearing and immobilization for the symptoms. The treatment provided mild relief.       Review of Systems  Musculoskeletal:  Positive for joint swelling.  Neurological:  Positive for numbness.  All other systems reviewed and are negative.      Objective:   Physical Exam Vitals and nursing note reviewed.  Constitutional:      Appearance: Normal appearance.  Cardiovascular:     Rate and Rhythm: Normal rate and regular rhythm.     Pulses: Normal pulses.          Dorsalis pedis pulses are 2+ on the right side.       Posterior tibial pulses are 2+ on the right side.  Pulmonary:     Effort: Pulmonary effort is normal. No respiratory distress.  Musculoskeletal:     Right ankle: Swelling and ecchymosis present. Tenderness present. Decreased range of motion. Normal pulse.     Left ankle: Normal.  Feet:     Right foot:     Skin integrity: Skin integrity normal.  Skin:    General: Skin is warm and dry.     Capillary Refill: Capillary refill takes less  than 2 seconds.     Findings: Bruising present.     Comments: Bruising to R foot and ankle  Neurological:     General: No focal deficit present.     Mental Status: She is alert and oriented to person, place, and time.  Psychiatric:        Mood and Affect: Mood normal.        Behavior: Behavior normal.      BP 104/71   Pulse 76   Temp 98 F (36.7 C) (Temporal)   Resp 20   Ht 5\' 3"  (1.6 m)   Wt 153 lb (69.4 kg)   SpO2 98%   BMI 27.10 kg/m       Assessment & Plan:  Joyce Rasmussen in today with chief complaint of Ankle Pain (Right ankle. Hurt while playing soccer on Saturday)   1. Acute right ankle pain - DG Ankle Complete Right 2. Closed fracture of malleolus of right ankle, initial encounter Go to Ortho appointment No weight bearing OTC tylenol PRN pain Rest Ice Elevate - Ambulatory referral to Orthopedic Surgery    The above assessment and management plan was discussed with the patient. The patient verbalized understanding of and has agreed to the management plan. Patient is aware to call the clinic if symptoms persist or worsen. Patient is aware when to return to the clinic  for a follow-up visit. Patient educated on when it is appropriate to go to the emergency department.   Consuello Bossier, FNP student  Bennie Pierini, FNP

## 2022-08-20 NOTE — Patient Instructions (Signed)
Ankle Fracture An ankle fracture is a break in one, two, or all three of the bones in your ankle joint. The ankle joint is made up of: The lower parts of your tibia and fibula. These are your lower leg bones. A bone in the foot called the talus. What are the causes? A hard, direct hit to the ankle. Twisting your ankle fast and very badly. Getting injured in a car crash or from a fall from high up. What increases the risk? Being overweight. Doing certain sports, such as soccer, gymnastics, or football. What are the signs or symptoms?  A tender and swollen ankle. Bruising around your ankle. Pain when you move or press on your ankle. Trouble walking. Trouble using your ankle to support your body weight (putting weight on your ankle). Pain that gets worse: When you move your ankle or foot. When you stand. Pain that gets better with rest. How is this treated? This condition may be treated with: A cast, boot, or splint. Crutches. Surgery. Staying off your injured foot. Exercises to make your ankle move better and get stronger. Follow these instructions at home: Your doctor may tell you to do these things: If you have a boot or splint: Wear the boot or splint as told by your doctor. Take it off only as told by your doctor. Loosen it if your toes: Tingle. Lose feeling (get numb). Turn cold and blue. Keep it clean and dry. If you have a cast: Do not put pressure on any part of the cast until it is fully hardened. Do not stick anything inside the cast to scratch your skin. Check the skin around the cast every day. Tell your doctor if you see problems. You may put lotion on dry skin around the cast. Do not put lotion on the skin under the cast. Keep it clean and dry. Bathing Do not take baths, swim, or use a hot tub. Ask your doctor about taking showers or sponge baths. If the cast, splint, or boot is not waterproof: Do not let it get wet. Cover it with a watertight covering when  you take a bath or shower. Managing pain, stiffness, and swelling  If told, put ice on the injured area. To do this: If you have a removable splint or boot, take it off as told by your doctor. Put ice in a plastic bag. Place a towel between your skin and the bag or between your cast and the bag. Leave the ice on for 20 minutes, 2-3 times a day. Take off the ice if your skin turns bright red. This is very important. If you cannot feel pain, heat, or cold, you have a greater risk of damage to the area. Move your toes often. Raise the injured area above the level of your heart while you are sitting or lying down. Activity Do exercises as told by your doctor. Return to your normal activities when your doctor says that it is safe. Use crutches to support your body weight. Do not use your injured leg to support your body weight until your doctor says that you can. General instructions Take over-the-counter and prescription medicines only as told by your doctor. Ask your doctor when it is safe to drive if you have a cast, boot, or splint on your leg. Do not smoke or use any products that contain nicotine or tobacco. These can delay bone healing. If you need help quitting, ask your doctor. Keep all follow-up visits. Contact a doctor if: Your  pain or swelling gets worse. Your pain or swelling does not get better with rest or medicine. Your cast gets damaged. Get help right away if: You have very bad pain that lasts. You get new pain or swelling. Your skin or toes below the injured ankle: Turn blue or gray. Feel cold. Lose feeling. Have less feeling than normal when something touches them. Summary An ankle fracture is a break in one, two, or three bones in your lower leg and foot. An ankle fracture may be treated with a cast, a boot, a splint, or surgery. Do not use your injured leg to support your body weight until your doctor says that you can. Use ice, take medicines, and raise your foot  as told. Do not smoke or use products that contain nicotine or tobacco. This information is not intended to replace advice given to you by your health care provider. Make sure you discuss any questions you have with your health care provider. Document Revised: 11/25/2019 Document Reviewed: 11/25/2019 Elsevier Patient Education  2023 [employer].

## 2022-08-21 ENCOUNTER — Encounter: Payer: Self-pay | Admitting: Family

## 2022-08-21 ENCOUNTER — Ambulatory Visit (INDEPENDENT_AMBULATORY_CARE_PROVIDER_SITE_OTHER): Payer: Commercial Managed Care - PPO | Admitting: Family

## 2022-08-21 DIAGNOSIS — S82891A Other fracture of right lower leg, initial encounter for closed fracture: Secondary | ICD-10-CM

## 2022-08-21 MED ORDER — OXYCODONE-ACETAMINOPHEN 5-325 MG PO TABS: 1.0000 | ORAL_TABLET | Freq: Four times a day (QID) | ORAL | 0 refills | Status: DC | PRN
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
## 2022-08-22 ENCOUNTER — Encounter (HOSPITAL_COMMUNITY): Payer: Self-pay | Admitting: Orthopedic Surgery

## 2022-08-22 ENCOUNTER — Other Ambulatory Visit: Payer: Self-pay

## 2022-08-22 NOTE — Progress Notes (Signed)
Spoke with pt for pre-op call. Pt denies any medical history.   Shower instructions given to pt and she voiced understanding.

## 2022-08-22 NOTE — Anesthesia Preprocedure Evaluation (Addendum)
Anesthesia Evaluation  Patient identified by MRN, date of birth, ID band Patient awake    Reviewed: Allergy & Precautions, NPO status , Patient's Chart, lab work & pertinent test results  Airway Mallampati: II  TM Distance: >3 FB Neck ROM: Full    Dental no notable dental hx.    Pulmonary neg pulmonary ROS   Pulmonary exam normal breath sounds clear to auscultation       Cardiovascular hypertension, Normal cardiovascular exam Rhythm:Regular Rate:Normal     Neuro/Psych negative neurological ROS  negative psych ROS   GI/Hepatic Neg liver ROS,GERD  ,,  Endo/Other    Renal/GU negative Renal ROS     Musculoskeletal   Abdominal   Peds  Hematology Lab Results      Component                Value               Date                      WBC                      6.4                 08/23/2022                HGB                      13.1                08/23/2022                HCT                      37.9                08/23/2022                MCV                      85.7                08/23/2022                PLT                      252                 08/23/2022              Anesthesia Other Findings   Reproductive/Obstetrics                             Anesthesia Physical Anesthesia Plan  ASA: 2  Anesthesia Plan: Regional and MAC   Post-op Pain Management: Regional block* and Minimal or no pain anticipated   Induction:   PONV Risk Score and Plan: 3 and Treatment may vary due to age or medical condition, Midazolam, Propofol infusion and Ondansetron  Airway Management Planned: Natural Airway and Nasal Cannula  Additional Equipment: None  Intra-op Plan:   Post-operative Plan:   Informed Consent: I have reviewed the patients History and Physical, chart, labs and discussed the procedure including the risks, benefits and alternatives for the proposed anesthesia with the patient or  authorized representative who has indicated his/her understanding  and acceptance.     Dental advisory given  Plan Discussed with:   Anesthesia Plan Comments: (R pop + adductor w MAc)        Anesthesia Quick Evaluation

## 2022-08-23 ENCOUNTER — Ambulatory Visit (HOSPITAL_COMMUNITY): Payer: Commercial Managed Care - PPO

## 2022-08-23 ENCOUNTER — Other Ambulatory Visit: Payer: Self-pay

## 2022-08-23 ENCOUNTER — Ambulatory Visit (HOSPITAL_BASED_OUTPATIENT_CLINIC_OR_DEPARTMENT_OTHER): Payer: Commercial Managed Care - PPO | Admitting: Anesthesiology

## 2022-08-23 ENCOUNTER — Ambulatory Visit (HOSPITAL_COMMUNITY)
Admission: RE | Admit: 2022-08-23 | Discharge: 2022-08-23 | Disposition: A | Discharge status: Home / Self Care | Payer: Commercial Managed Care - PPO | Attending: Orthopedic Surgery | Admitting: Orthopedic Surgery

## 2022-08-23 ENCOUNTER — Ambulatory Visit (HOSPITAL_COMMUNITY): Payer: Commercial Managed Care - PPO | Admitting: Anesthesiology

## 2022-08-23 ENCOUNTER — Encounter (HOSPITAL_COMMUNITY): Payer: Self-pay | Admitting: Orthopedic Surgery

## 2022-08-23 ENCOUNTER — Encounter (HOSPITAL_COMMUNITY)
Admission: RE | Disposition: A | Discharge status: Home / Self Care | Payer: Self-pay | Source: Home / Self Care | Attending: Orthopedic Surgery

## 2022-08-23 DIAGNOSIS — S82891A Other fracture of right lower leg, initial encounter for closed fracture: Secondary | ICD-10-CM | POA: Diagnosis not present

## 2022-08-23 DIAGNOSIS — X501XXA Overexertion from prolonged static or awkward postures, initial encounter: Secondary | ICD-10-CM | POA: Diagnosis not present

## 2022-08-23 DIAGNOSIS — W2102XA Struck by soccer ball, initial encounter: Secondary | ICD-10-CM | POA: Insufficient documentation

## 2022-08-23 DIAGNOSIS — S82831A Other fracture of upper and lower end of right fibula, initial encounter for closed fracture: Secondary | ICD-10-CM | POA: Diagnosis present

## 2022-08-23 DIAGNOSIS — I10 Essential (primary) hypertension: Secondary | ICD-10-CM | POA: Diagnosis not present

## 2022-08-23 HISTORY — DX: Gastro-esophageal reflux disease without esophagitis: K21.9

## 2022-08-23 HISTORY — PX: ORIF ANKLE FRACTURE: SHX5408

## 2022-08-23 HISTORY — DX: Anemia, unspecified: D64.9

## 2022-08-23 LAB — CBC
HCT: 37.9 % (ref 36.0–46.0)
Hemoglobin: 13.1 g/dL (ref 12.0–15.0)
MCH: 29.6 pg (ref 26.0–34.0)
MCHC: 34.6 g/dL (ref 30.0–36.0)
MCV: 85.7 fL (ref 80.0–100.0)
Platelets: 252 10*3/uL (ref 150–400)
RBC: 4.42 MIL/uL (ref 3.87–5.11)
RDW: 14.5 % (ref 11.5–15.5)
WBC: 6.4 10*3/uL (ref 4.0–10.5)
nRBC: 0 % (ref 0.0–0.2)

## 2022-08-23 LAB — SURGICAL PCR SCREEN
MRSA, PCR: NEGATIVE
Staphylococcus aureus: NEGATIVE

## 2022-08-23 SURGERY — OPEN REDUCTION INTERNAL FIXATION (ORIF) ANKLE FRACTURE
Anesthesia: Monitor Anesthesia Care | Site: Ankle | Laterality: Right

## 2022-08-23 MED ORDER — ONDANSETRON HCL 4 MG/2ML IJ SOLN
INTRAMUSCULAR | Status: DC | PRN
  Administered 2022-08-23: 4 mg via INTRAVENOUS

## 2022-08-23 MED ORDER — CHLORHEXIDINE GLUCONATE 0.12 % MT SOLN
OROMUCOSAL | Status: AC
  Administered 2022-08-23: 15 mL via OROMUCOSAL
  Filled 2022-08-23: qty 15

## 2022-08-23 MED ORDER — MIDAZOLAM HCL 2 MG/2ML IJ SOLN: 2.0000 mg | Freq: Once | INTRAMUSCULAR | Status: AC

## 2022-08-23 MED ORDER — LACTATED RINGERS IV SOLN: INTRAVENOUS | Status: DC

## 2022-08-23 MED ORDER — CLONIDINE HCL (ANALGESIA) 100 MCG/ML EP SOLN
EPIDURAL | Status: DC | PRN
  Administered 2022-08-23 (×2): 100 ug

## 2022-08-23 MED ORDER — AMISULPRIDE (ANTIEMETIC) 5 MG/2ML IV SOLN: 10.0000 mg | Freq: Once | INTRAVENOUS | Status: DC | PRN

## 2022-08-23 MED ORDER — MIDAZOLAM HCL 2 MG/2ML IJ SOLN
INTRAMUSCULAR | Status: AC
  Administered 2022-08-23: 2 mg via INTRAVENOUS
  Filled 2022-08-23: qty 2

## 2022-08-23 MED ORDER — PROPOFOL 500 MG/50ML IV EMUL
INTRAVENOUS | Status: DC | PRN
  Administered 2022-08-23: 80 ug/kg/min via INTRAVENOUS

## 2022-08-23 MED ORDER — HYDROMORPHONE HCL 1 MG/ML IJ SOLN: 0.2500 mg | INTRAMUSCULAR | Status: DC | PRN

## 2022-08-23 MED ORDER — FENTANYL CITRATE (PF) 100 MCG/2ML IJ SOLN: 50.0000 ug | Freq: Once | INTRAMUSCULAR | Status: DC

## 2022-08-23 MED ORDER — PROPOFOL 1000 MG/100ML IV EMUL
INTRAVENOUS | Status: AC
  Filled 2022-08-23: qty 100

## 2022-08-23 MED ORDER — CHLORHEXIDINE GLUCONATE 0.12 % MT SOLN
15.0000 mL | OROMUCOSAL | Status: AC
  Filled 2022-08-23: qty 15

## 2022-08-23 MED ORDER — OXYCODONE HCL 5 MG/5ML PO SOLN: 5.0000 mg | Freq: Once | ORAL | Status: DC | PRN

## 2022-08-23 MED ORDER — FENTANYL CITRATE (PF) 250 MCG/5ML IJ SOLN
INTRAMUSCULAR | Status: AC
  Filled 2022-08-23: qty 5

## 2022-08-23 MED ORDER — FENTANYL CITRATE (PF) 100 MCG/2ML IJ SOLN
INTRAMUSCULAR | Status: AC
  Administered 2022-08-23: 50 ug
  Filled 2022-08-23: qty 2

## 2022-08-23 MED ORDER — ONDANSETRON HCL 4 MG/2ML IJ SOLN
INTRAMUSCULAR | Status: AC
  Filled 2022-08-23: qty 2

## 2022-08-23 MED ORDER — ACETAMINOPHEN 10 MG/ML IV SOLN: 1000.0000 mg | Freq: Once | INTRAVENOUS | Status: DC | PRN

## 2022-08-23 MED ORDER — PROPOFOL 10 MG/ML IV BOLUS
INTRAVENOUS | Status: DC | PRN
  Administered 2022-08-23: 30 mg via INTRAVENOUS
  Administered 2022-08-23: 20 mg via INTRAVENOUS
  Administered 2022-08-23: 40 mg via INTRAVENOUS
  Administered 2022-08-23: 30 mg via INTRAVENOUS

## 2022-08-23 MED ORDER — OXYCODONE HCL 5 MG PO TABS: 5.0000 mg | ORAL_TABLET | Freq: Once | ORAL | Status: DC | PRN

## 2022-08-23 MED ORDER — CEFAZOLIN SODIUM-DEXTROSE 2-4 GM/100ML-% IV SOLN
2.0000 g | INTRAVENOUS | Status: AC
  Administered 2022-08-23: 2 g via INTRAVENOUS
  Filled 2022-08-23: qty 100

## 2022-08-23 MED ORDER — PROPOFOL 10 MG/ML IV BOLUS
INTRAVENOUS | Status: AC
  Filled 2022-08-23: qty 20

## 2022-08-23 MED ORDER — ROPIVACAINE HCL 5 MG/ML IJ SOLN
INTRAMUSCULAR | Status: DC | PRN
  Administered 2022-08-23: 18 mL via PERINEURAL
  Administered 2022-08-23: 30 mL via PERINEURAL

## 2022-08-23 MED ORDER — ONDANSETRON HCL 4 MG/2ML IJ SOLN: 4.0000 mg | Freq: Once | INTRAMUSCULAR | Status: DC | PRN

## 2022-08-23 SURGICAL SUPPLY — 44 items
BAG COUNTER SPONGE SURGICOUNT (BAG) ×1 IMPLANT
BANDAGE ESMARK 6X9 LF (GAUZE/BANDAGES/DRESSINGS) IMPLANT
BIT DRILL 110X2.5XQCK CNCT (BIT) IMPLANT
BIT DRILL 2.5 (BIT) ×1
BIT DRL 110X2.5XQCK CNCT (BIT) ×1
BNDG COHESIVE 4X5 TAN STRL (GAUZE/BANDAGES/DRESSINGS) ×1 IMPLANT
BNDG COHESIVE 6X5 TAN NS LF (GAUZE/BANDAGES/DRESSINGS) IMPLANT
BNDG ESMARK 6X9 LF (GAUZE/BANDAGES/DRESSINGS)
BNDG GAUZE DERMACEA FLUFF 4 (GAUZE/BANDAGES/DRESSINGS) ×1 IMPLANT
COVER SURGICAL LIGHT HANDLE (MISCELLANEOUS) ×1 IMPLANT
DRAPE OEC MINIVIEW 54X84 (DRAPES) IMPLANT
DRAPE U-SHAPE 47X51 STRL (DRAPES) ×1 IMPLANT
DRSG ADAPTIC 3X8 NADH LF (GAUZE/BANDAGES/DRESSINGS) ×1 IMPLANT
DURAPREP 26ML APPLICATOR (WOUND CARE) ×1 IMPLANT
ELECT REM PT RETURN 9FT ADLT (ELECTROSURGICAL) ×1
ELECTRODE REM PT RTRN 9FT ADLT (ELECTROSURGICAL) ×1 IMPLANT
GAUZE PAD ABD 8X10 STRL (GAUZE/BANDAGES/DRESSINGS) ×1 IMPLANT
GAUZE SPONGE 4X4 12PLY STRL (GAUZE/BANDAGES/DRESSINGS) ×1 IMPLANT
GLOVE BIOGEL PI IND STRL 9 (GLOVE) ×1 IMPLANT
GLOVE SURG ORTHO 9.0 STRL STRW (GLOVE) ×1 IMPLANT
GOWN STRL REUS W/ TWL XL LVL3 (GOWN DISPOSABLE) ×3 IMPLANT
GOWN STRL REUS W/TWL XL LVL3 (GOWN DISPOSABLE) ×3
KIT BASIN OR (CUSTOM PROCEDURE TRAY) ×1 IMPLANT
KIT TURNOVER KIT B (KITS) ×1 IMPLANT
MANIFOLD NEPTUNE II (INSTRUMENTS) ×1 IMPLANT
NS IRRIG 1000ML POUR BTL (IV SOLUTION) ×1 IMPLANT
PACK ORTHO EXTREMITY (CUSTOM PROCEDURE TRAY) ×1 IMPLANT
PAD ABD 7.5X8 STRL (GAUZE/BANDAGES/DRESSINGS) IMPLANT
PAD ARMBOARD 7.5X6 YLW CONV (MISCELLANEOUS) ×2 IMPLANT
PLATE 7HOLE 1/3 TUBULAR (Plate) IMPLANT
SCREW CORT 2.5X20X3.5XST SM (Screw) IMPLANT
SCREW CORTICAL 3.5 16MM (Screw) IMPLANT
SCREW CORTICAL 3.5X12 (Screw) IMPLANT
SCREW CORTICAL 3.5X20 (Screw) ×1 IMPLANT
STAPLER VISISTAT 35W (STAPLE) IMPLANT
SUCTION FRAZIER HANDLE 10FR (MISCELLANEOUS) ×1
SUCTION TUBE FRAZIER 10FR DISP (MISCELLANEOUS) ×1 IMPLANT
SUT ETHILON 2 0 PSLX (SUTURE) IMPLANT
SUT VIC AB 2-0 CT1 27 (SUTURE) ×1
SUT VIC AB 2-0 CT1 TAPERPNT 27 (SUTURE) ×1 IMPLANT
TOWEL GREEN STERILE (TOWEL DISPOSABLE) ×1 IMPLANT
TOWEL GREEN STERILE FF (TOWEL DISPOSABLE) ×1 IMPLANT
TUBE CONNECTING 12X1/4 (SUCTIONS) ×1 IMPLANT
YANKAUER SUCT BULB TIP NO VENT (SUCTIONS) IMPLANT

## 2022-08-23 NOTE — Anesthesia Postprocedure Evaluation (Signed)
Anesthesia Post Note  Patient: Joyce Rasmussen  Procedure(s) Performed: OPEN REDUCTION INTERNAL FIXATION RIGHT ANKLE FRACTURE (Right: Ankle)     Patient location during evaluation: PACU Anesthesia Type: Regional Level of consciousness: awake and alert Pain management: pain level controlled Vital Signs Assessment: post-procedure vital signs reviewed and stable Respiratory status: spontaneous breathing, nonlabored ventilation, respiratory function stable and patient connected to nasal cannula oxygen Cardiovascular status: stable and blood pressure returned to baseline Postop Assessment: no apparent nausea or vomiting Anesthetic complications: no  No notable events documented.  Last Vitals:  Vitals:   08/23/22 1330 08/23/22 1345  BP: 110/79 111/78  Pulse: (!) 55 61  Resp: 13 20  Temp:    SpO2: 100% 100%    Last Pain:  Vitals:   08/23/22 1345  TempSrc:   PainSc: 0-No pain                 Trevor Iha

## 2022-08-23 NOTE — Anesthesia Procedure Notes (Signed)
Anesthesia Regional Block: Popliteal block   Pre-Anesthetic Checklist: , timeout performed,  Correct Patient, Correct Site, Correct Laterality,  Correct Procedure, Correct Position, site marked,  Risks and benefits discussed,  Pre-op evaluation,  At surgeon's request and post-op pain management  Laterality: Lower  Prep: Maximum Sterile Barrier Precautions used, chloraprep       Needles:  Injection technique: Single-shot  Needle Type: Echogenic Needle     Needle Length: 9cm  Needle Gauge: 21     Additional Needles:   Procedures:,,,, ultrasound used (permanent image in chart),,    Narrative:  Start time: 08/23/2022 10:13 AM End time: 08/23/2022 10:18 AM Injection made incrementally with aspirations every 5 mL.  Performed by: Personally  Anesthesiologist: Trevor Iha, MD  Additional Notes: Block assessed. Patient tolerated procedure well.

## 2022-08-23 NOTE — Interval H&P Note (Signed)
History and Physical Interval Note:  08/23/2022 9:49 AM  Joyce Rasmussen  has presented today for surgery, with the diagnosis of Right Distal Fibula Fracture.  The various methods of treatment have been discussed with the patient and family. After consideration of risks, benefits and other options for treatment, the patient has consented to  Procedure(s): OPEN REDUCTION INTERNAL FIXATION (ORIF) RIGHT ANKLE FRACTURE (Right) as a surgical intervention.  The patient's history has been reviewed, patient examined, no change in status, stable for surgery.  I have reviewed the patient's chart and labs.  Questions were answered to the patient's satisfaction.     [name]

## 2022-08-23 NOTE — Transfer of Care (Signed)
Immediate Anesthesia Transfer of Care Note  Patient: Koa Brazill  Procedure(s) Performed: OPEN REDUCTION INTERNAL FIXATION RIGHT ANKLE FRACTURE (Right: Ankle)  Patient Location: PACU  Anesthesia Type:MAC and Regional  Level of Consciousness: awake, drowsy, patient cooperative, and responds to stimulation  Airway & Oxygen Therapy: Patient Spontanous Breathing and Patient connected to face mask oxygen  Post-op Assessment: Report given to RN and Post -op Vital signs reviewed and stable  Post vital signs: Reviewed and stable  Last Vitals:  Vitals Value Taken Time  BP    Temp 36.9 C 08/23/22 1323  Pulse 57 08/23/22 1325  Resp 13 08/23/22 1325  SpO2 100 % 08/23/22 1325  Vitals shown include unvalidated device data.  Last Pain:  Vitals:   08/23/22 1030  TempSrc:   PainSc: 0-No pain      Patients Stated Pain Goal: 0 (76/54/65 0354)  Complications: No notable events documented.

## 2022-08-23 NOTE — Anesthesia Procedure Notes (Signed)
Anesthesia Regional Block: Adductor canal block   Pre-Anesthetic Checklist: , timeout performed,  Correct Patient, Correct Site, Correct Laterality,  Correct Procedure, Correct Position, site marked,  Risks and benefits discussed,  Surgical consent,  Pre-op evaluation,  At surgeon's request and post-op pain management  Laterality: Lower and Right  Prep: chloraprep       Needles:  Injection technique: Single-shot  Needle Type: Echogenic Needle     Needle Length: 9cm  Needle Gauge: 22     Additional Needles:   Procedures:,,,, ultrasound used (permanent image in chart),,    Narrative:  Start time: 08/23/2022 10:18 AM End time: 08/23/2022 10:22 AM Injection made incrementally with aspirations every 5 mL.  Performed by: Personally  Anesthesiologist: Trevor Iha, MD  Additional Notes: Block assessed prior to surgery. Pt tolerated procedure well.

## 2022-08-23 NOTE — Op Note (Signed)
08/23/2022  2:59 PM  PATIENT:  Joyce Rasmussen    PRE-OPERATIVE DIAGNOSIS:  Right Distal Fibula Fracture  POST-OPERATIVE DIAGNOSIS:  Same  PROCEDURE:  OPEN REDUCTION INTERNAL FIXATION RIGHT ANKLE FRACTURE C-arm fluoroscopy to verify reduction.  SURGEON:  [name], MD  PHYSICIAN ASSISTANT:None ANESTHESIA:   General  PREOPERATIVE INDICATIONS:  [name] is a  36 y.o. female with a diagnosis of Right Distal Fibula Fracture who failed conservative measures and elected for surgical management.    The risks benefits and alternatives were discussed with the patient preoperatively including but not limited to the risks of infection, bleeding, nerve injury, cardiopulmonary complications, the need for revision surgery, among others, and the patient was willing to proceed.  OPERATIVE IMPLANTS: 7 hole one third tubular plate with interfrag screw and compression screws proximally and distally.  @ENCIMAGES @  OPERATIVE FINDINGS: C-arm fluoroscopy verified a reduced syndesmosis a congruent mortise the fibula was out to length.  OPERATIVE PROCEDURE: Patient brought the operating room and underwent a general anesthetic.  After adequate levels anesthesia were obtained patient's right lower extremity was prepped using DuraPrep draped into a sterile field a timeout was called.  A lateral incision was made over the fibula this was carried sharply down to bone and subperiosteal dissection was used to debride the fracture site.  The fracture site was opened cleansed and debrided.  The fracture was reduced and a interfrag screw 20 mm in length was placed.  A anterior glide lateral plate was applied curved distally and this was secured proximally with 2 compression screws and distally with a compression screw.  C-arm possibly verified reduction of the fracture the fibula was out to length the syndesmosis was congruent.  The wound was irrigated with normal saline incision closed using 2-0 nylon a sterile dressing  was applied.   DISCHARGE PLANNING:  Antibiotic duration: Preoperative antibiotics  Weightbearing: Nonweightbearing on the right  Pain medication: Patient has a history for Percocet  Dressing care/ Wound VAC: Dry dressing  Ambulatory devices: Crutches and walker  Discharge to: Home.  Follow-up: In the office 1 week post operative.

## 2022-08-23 NOTE — H&P (Signed)
Joyce Rasmussen is an 36 y.o. female.   Chief Complaint: Right ankle pain and deformity HPI: The patient is a 36 year old woman who presents for initial evaluation of a right ankle injury she states that on Saturday she was playing soccer she stepped on the ball and twisted her right ankle. She was seen by an outside medical clinic for the same. Today she is in ankle brace using crutches for nonweightbearing. She states she has been using ibuprofen and Tylenol for pain   Past Medical History:  Diagnosis Date   Anemia    GERD (gastroesophageal reflux disease)     Past Surgical History:  Procedure Laterality Date   NO PAST SURGERIES      Family History  Problem Relation Age of Onset   Hyperlipidemia Father    Social History:  reports that she has never smoked. She has never used smokeless tobacco. She reports that she does not drink alcohol and does not use drugs.  Allergies: No Known Allergies  No medications prior to admission.    No results found for this or any previous visit (from the past 48 hour(s)). No results found.  Review of Systems  All other systems reviewed and are negative.   Last menstrual period 07/29/2022. Physical Exam  Examination patient has ecchymosis and bruising laterally.  She has a palpable pulse.  Radiograph shows a displaced Weber B fibula fracture. Assessment/Plan Assessment: Displaced Weber B fibular fracture.  Plan: Will plan for open reduction internal fixation of the fibula fracture.  Risk and benefits were discussed including infection neurovascular injury persistent pain need for additional surgery.  Patient states she understands wished to proceed at this time.  [name], MD 08/23/2022, 6:56 AM

## 2022-08-23 NOTE — Progress Notes (Signed)
Patient unable to void for urine preg, made anesthesia aware. Anesthesiologist questioned patient if she could be pregnant and she stated no. No need to obtain urine sample for preg per anesthesia.

## 2022-08-30 ENCOUNTER — Ambulatory Visit (INDEPENDENT_AMBULATORY_CARE_PROVIDER_SITE_OTHER): Payer: Commercial Managed Care - PPO | Admitting: Family

## 2022-08-30 DIAGNOSIS — S82891D Other fracture of right lower leg, subsequent encounter for closed fracture with routine healing: Secondary | ICD-10-CM
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
## 2022-09-11 ENCOUNTER — Ambulatory Visit (INDEPENDENT_AMBULATORY_CARE_PROVIDER_SITE_OTHER): Payer: Commercial Managed Care - PPO | Admitting: Nurse Practitioner

## 2022-09-11 ENCOUNTER — Encounter: Payer: Self-pay | Admitting: Nurse Practitioner

## 2022-09-11 VITALS — BP 117/82 | HR 85 | Temp 98.5°F | Ht 63.0 in | Wt 144.0 lb

## 2022-09-11 DIAGNOSIS — J0110 Acute frontal sinusitis, unspecified: Secondary | ICD-10-CM

## 2022-09-11 MED ORDER — AMOXICILLIN-POT CLAVULANATE 875-125 MG PO TABS: 1.0000 | ORAL_TABLET | Freq: Two times a day (BID) | ORAL | 0 refills | Status: DC

## 2022-09-11 MED ORDER — FLUTICASONE PROPIONATE 50 MCG/ACT NA SUSP: 2.0000 | Freq: Every day | NASAL | 6 refills | Status: DC

## 2022-09-11 NOTE — Patient Instructions (Signed)

## 2022-09-11 NOTE — Progress Notes (Signed)
Acute Office Visit  Subjective:     Patient ID: Joyce Rasmussen, female    DOB: 19-Feb-1986, 37 y.o.   MRN: [id number]  Chief Complaint  Patient presents with   Facial Pain   Nasal Congestion    Sinusitis This is a new problem. Episode onset: in the past 3 days. The problem is unchanged. The pain is moderate. Associated symptoms include congestion, headaches and sinus pressure. Pertinent negatives include no chills. Past treatments include acetaminophen. The treatment provided no relief.    Review of Systems  Constitutional: Negative.  Negative for chills and fever.  HENT:  Positive for congestion and sinus pressure.   Respiratory: Negative.    Cardiovascular: Negative.   Gastrointestinal: Negative.   Skin: Negative.  Negative for itching and rash.  Neurological:  Positive for headaches.  All other systems reviewed and are negative.       Objective:    BP 117/82   Pulse 85   Temp 98.5 F (36.9 C)   Ht 5\' 3"  (1.6 m)   Wt 144 lb (65.3 kg)   LMP 08/28/2022   SpO2 97%   BMI 25.51 kg/m  Wt Readings from Last 3 Encounters:  09/11/22 144 lb (65.3 kg)  08/23/22 148 lb (67.1 kg)  08/20/22 153 lb (69.4 kg)      Physical Exam Vitals and nursing note reviewed.  Constitutional:      Appearance: Normal appearance.  HENT:     Head: Normocephalic.     Right Ear: External ear normal.     Left Ear: External ear normal.     Nose: Congestion present.     Mouth/Throat:     Mouth: Mucous membranes are moist.     Pharynx: Oropharynx is clear.  Eyes:     Conjunctiva/sclera: Conjunctivae normal.  Cardiovascular:     Rate and Rhythm: Normal rate and regular rhythm.     Pulses: Normal pulses.     Heart sounds: Normal heart sounds.  Pulmonary:     Effort: Pulmonary effort is normal.     Breath sounds: Normal breath sounds.  Abdominal:     General: Bowel sounds are normal.  Neurological:     General: No focal deficit present.     Mental Status: She is alert and oriented to  person, place, and time.  Psychiatric:        Behavior: Behavior normal.     No results found for any visits on 09/11/22.      Assessment & Plan:  Patient presents with sinusitis, advice patient to take medication as prescribe - Use a cool mist humidifier  -Use saline nose sprays frequently -Force fluids -For fever or aches or pains- take Tylenol or ibuprofen. -If symptoms do not improve, she may need to be COVID tested to rule this out Follow up with worsening unresolved symptoms  Problem List Items Addressed This Visit   None Visit Diagnoses     Acute non-recurrent frontal sinusitis    -  Primary   Relevant Medications   amoxicillin-clavulanate (AUGMENTIN) 875-125 MG tablet   fluticasone (FLONASE) 50 MCG/ACT nasal spray       Meds ordered this encounter  Medications   amoxicillin-clavulanate (AUGMENTIN) 875-125 MG tablet    Sig: Take 1 tablet by mouth 2 (two) times daily.    Dispense:  14 tablet    Refill:  0    Order Specific Question:   Supervising Provider    Answer:   Claretta Fraise [[id number]]   fluticasone (  FLONASE) 50 MCG/ACT nasal spray    Sig: Place 2 sprays into both nostrils daily.    Dispense:  16 g    Refill:  6    Order Specific Question:   Supervising Provider    Answer:   Claretta Fraise [phone]    No follow-ups on file.  Ivy Lynn, NP

## 2022-09-12 ENCOUNTER — Ambulatory Visit (INDEPENDENT_AMBULATORY_CARE_PROVIDER_SITE_OTHER): Payer: Commercial Managed Care - PPO

## 2022-09-12 ENCOUNTER — Ambulatory Visit (INDEPENDENT_AMBULATORY_CARE_PROVIDER_SITE_OTHER): Payer: Commercial Managed Care - PPO | Admitting: Orthopedic Surgery

## 2022-09-12 DIAGNOSIS — S82891D Other fracture of right lower leg, subsequent encounter for closed fracture with routine healing: Secondary | ICD-10-CM | POA: Diagnosis not present

## 2022-09-13 ENCOUNTER — Encounter: Payer: Self-pay | Admitting: Orthopedic Surgery
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
## 2022-09-27 ENCOUNTER — Encounter: Payer: Self-pay | Admitting: Family

## 2022-09-27 ENCOUNTER — Ambulatory Visit: Payer: Commercial Managed Care - PPO | Admitting: Family

## 2022-09-27 VITALS — BP 110/78 | HR 96 | Temp 97.5°F | Ht 63.0 in | Wt 143.4 lb

## 2022-09-27 DIAGNOSIS — R509 Fever, unspecified: Secondary | ICD-10-CM

## 2022-09-27 DIAGNOSIS — J020 Streptococcal pharyngitis: Secondary | ICD-10-CM

## 2022-09-27 LAB — VERITOR FLU A/B WAIVED
Influenza A: NEGATIVE
Influenza B: NEGATIVE

## 2022-09-27 LAB — RAPID STREP SCREEN (MED CTR MEBANE ONLY): Strep Gp A Ag, IA W/Reflex: POSITIVE — AB

## 2022-09-27 MED ORDER — AMOXICILLIN 500 MG PO CAPS: 500.0000 mg | ORAL_CAPSULE | Freq: Two times a day (BID) | ORAL | 0 refills | Status: AC

## 2022-09-27 NOTE — Patient Instructions (Signed)

## 2022-09-27 NOTE — Progress Notes (Signed)
Subjective:    Patient ID: Joyce Rasmussen, female    DOB: [birth year], 37 y.o.   MRN: [id number]  Chief Complaint  Patient presents with   Fever   Sore Throat   Ear Pain   Pt presents to the office today with sore throat that started two days ago.  Fever  Associated symptoms include ear pain. Pertinent negatives include no congestion, coughing or headaches.  Sore Throat  This is a new problem. The current episode started in the past 7 days. The problem has been gradually worsening. The pain is worse on the left side. The maximum temperature recorded prior to her arrival was 100.4 - 100.9 F. The pain is at a severity of 5/10. The pain is moderate. Associated symptoms include ear pain, swollen glands and trouble swallowing. Pertinent negatives include no congestion, coughing, headaches or shortness of breath. She has had no exposure to strep. She has tried acetaminophen and NSAIDs for the symptoms. The treatment provided mild relief.      Review of Systems  Constitutional:  Positive for fever.  HENT:  Positive for ear pain and trouble swallowing. Negative for congestion.   Respiratory:  Negative for cough and shortness of breath.   Neurological:  Negative for headaches.  All other systems reviewed and are negative.      Objective:   Physical Exam Vitals reviewed.  Constitutional:      General: She is not in acute distress.    Appearance: She is well-developed.  HENT:     Head: Normocephalic and atraumatic.     Mouth/Throat:     Pharynx: Posterior oropharyngeal erythema present.  Eyes:     Pupils: Pupils are equal, round, and reactive to light.  Neck:     Thyroid: No thyromegaly.  Cardiovascular:     Rate and Rhythm: Normal rate and regular rhythm.     Heart sounds: Normal heart sounds. No murmur heard. Pulmonary:     Effort: Pulmonary effort is normal. No respiratory distress.     Breath sounds: Normal breath sounds. No wheezing.  Abdominal:     General: Bowel sounds are  normal. There is no distension.     Palpations: Abdomen is soft.     Tenderness: There is no abdominal tenderness.  Musculoskeletal:        General: No tenderness. Normal range of motion.     Cervical back: Normal range of motion and neck supple.  Skin:    General: Skin is warm and dry.  Neurological:     Mental Status: She is alert and oriented to person, place, and time.     Cranial Nerves: No cranial nerve deficit.     Deep Tendon Reflexes: Reflexes are normal and symmetric.  Psychiatric:        Behavior: Behavior normal.        Thought Content: Thought content normal.        Judgment: Judgment normal.          BP 110/78   Pulse 96   Temp (!) 97.5 F (36.4 C) (Temporal)   Ht 5\' 3"  (1.6 m)   Wt 143 lb 6.4 oz (65 kg)   LMP 08/28/2022   SpO2 98%   BMI 25.40 kg/m   Assessment & Plan:  Joyce Rasmussen comes in today with chief complaint of Fever, Sore Throat, and Ear Pain   Diagnosis and orders addressed:  1. Fever, unspecified fever cause - Veritor Flu A/B Waived - Rapid Strep Screen ([facility]  ONLY) - Novel Coronavirus, NAA ([facility])  2. Strep throat - Take meds as prescribed - Use a cool mist humidifier  -Use saline nose sprays frequently -Force fluids -For any cough or congestion  Use plain Mucinex- regular strength or max strength is fine -For fever or aces or pains- take tylenol or ibuprofen. -Throat lozenges if help -New toothbrush in 3 days  Follow up if symptoms worsen or do not improve  - amoxicillin (AMOXIL) 500 MG capsule; Take 1 capsule (500 mg total) by mouth 2 (two) times daily for 10 days.  Dispense: 20 capsule; Refill: 0  Evelina Dun, FNP

## 2022-09-28 LAB — NOVEL CORONAVIRUS, NAA: SARS-CoV-2, NAA: NOT DETECTED

## 2022-10-10 ENCOUNTER — Ambulatory Visit (INDEPENDENT_AMBULATORY_CARE_PROVIDER_SITE_OTHER): Payer: Commercial Managed Care - PPO

## 2022-10-10 ENCOUNTER — Ambulatory Visit (INDEPENDENT_AMBULATORY_CARE_PROVIDER_SITE_OTHER): Payer: Commercial Managed Care - PPO | Admitting: Orthopedic Surgery

## 2022-10-10 DIAGNOSIS — S82891D Other fracture of right lower leg, subsequent encounter for closed fracture with routine healing: Secondary | ICD-10-CM

## 2022-10-11 ENCOUNTER — Encounter: Payer: Self-pay | Admitting: Orthopedic Surgery
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
## 2023-10-02 ENCOUNTER — Ambulatory Visit: Payer: Commercial Managed Care - PPO | Admitting: Nurse Practitioner

## 2023-10-02 ENCOUNTER — Encounter: Payer: Self-pay | Admitting: Nurse Practitioner

## 2023-10-02 ENCOUNTER — Ambulatory Visit (INDEPENDENT_AMBULATORY_CARE_PROVIDER_SITE_OTHER): Payer: Commercial Managed Care - PPO

## 2023-10-02 VITALS — BP 112/77 | HR 65 | Temp 98.0°F | Ht 63.0 in | Wt 161.0 lb

## 2023-10-02 DIAGNOSIS — M25571 Pain in right ankle and joints of right foot: Secondary | ICD-10-CM

## 2023-10-02 DIAGNOSIS — S82861A Displaced Maisonneuve's fracture of right leg, initial encounter for closed fracture: Secondary | ICD-10-CM

## 2023-10-02 NOTE — Addendum Note (Signed)
Addended by: Bennie Pierini on: 10/02/2023 04:09 PM   Modules accepted: Orders
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
## 2023-10-03 ENCOUNTER — Encounter: Payer: Self-pay | Admitting: Physician Assistant

## 2023-10-03 ENCOUNTER — Ambulatory Visit: Payer: Commercial Managed Care - PPO | Admitting: Physician Assistant

## 2023-10-03 DIAGNOSIS — S82401A Unspecified fracture of shaft of right fibula, initial encounter for closed fracture: Secondary | ICD-10-CM | POA: Diagnosis not present
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
## 2023-10-06 ENCOUNTER — Ambulatory Visit: Payer: Commercial Managed Care - PPO | Admitting: Orthopedic Surgery

## 2023-10-06 DIAGNOSIS — M25571 Pain in right ankle and joints of right foot: Secondary | ICD-10-CM

## 2023-10-06 DIAGNOSIS — S82401A Unspecified fracture of shaft of right fibula, initial encounter for closed fracture: Secondary | ICD-10-CM | POA: Diagnosis not present

## 2023-10-14 ENCOUNTER — Encounter: Payer: Self-pay | Admitting: Orthopedic Surgery
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
## 2023-11-03 ENCOUNTER — Other Ambulatory Visit (INDEPENDENT_AMBULATORY_CARE_PROVIDER_SITE_OTHER): Payer: Commercial Managed Care - PPO

## 2023-11-03 ENCOUNTER — Ambulatory Visit: Payer: Commercial Managed Care - PPO | Admitting: Orthopedic Surgery

## 2023-11-03 DIAGNOSIS — M25571 Pain in right ankle and joints of right foot: Secondary | ICD-10-CM | POA: Diagnosis not present

## 2023-11-04 ENCOUNTER — Encounter: Payer: Self-pay | Admitting: Orthopedic Surgery
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
## 2024-01-07 ENCOUNTER — Encounter: Payer: Self-pay | Admitting: Family Medicine

## 2024-01-07 ENCOUNTER — Ambulatory Visit: Admitting: Family Medicine

## 2024-01-07 VITALS — BP 112/77 | HR 75 | Temp 97.2°F | Ht 63.0 in | Wt 160.4 lb

## 2024-01-07 DIAGNOSIS — J0110 Acute frontal sinusitis, unspecified: Secondary | ICD-10-CM

## 2024-01-07 MED ORDER — LORATADINE 10 MG PO TABS: 10.0000 mg | ORAL_TABLET | Freq: Every day | ORAL | 11 refills | Status: DC

## 2024-01-07 MED ORDER — FLUTICASONE PROPIONATE 50 MCG/ACT NA SUSP: 2.0000 | Freq: Every day | NASAL | 6 refills | Status: DC

## 2024-01-07 MED ORDER — AMOXICILLIN-POT CLAVULANATE 875-125 MG PO TABS: 1.0000 | ORAL_TABLET | Freq: Two times a day (BID) | ORAL | 0 refills | Status: AC

## 2024-01-07 NOTE — Progress Notes (Signed)
 Subjective:  Patient ID: Joyce Rasmussen, female    DOB: 06-20-1986, 38 y.o.   MRN: [id number]  Patient Care Team: Delfina Feller, FNP as PCP - General (Nurse Practitioner)   Chief Complaint:  Headache, Cough, and Nasal Congestion (X 1 week ago )   HPI: Joyce Rasmussen is a 38 y.o. female presenting on 01/07/2024 for Headache, Cough, and Nasal Congestion (X 1 week ago )   History of Present Illness   Joyce Rasmussen is a 38 year old female who presents with headaches, congestion, and sinus pressure.  She has been experiencing headaches, congestion, and a feeling of pressure in the back of her head and forehead since last Tuesday. Initially, these symptoms were accompanied by a fever that lasted until Wednesday. By Thursday, the fever subsided, but significant congestion and fatigue persisted.  The nasal congestion is characterized by greenish discharge with blood when she blows her nose. She also experiences postnasal drainage, which contributes to nausea. She has been managing her symptoms with Tylenol  but has not taken any allergy medications.  No current fever and she has not been around anyone who is sick. She has no history of previous sinus infections.          Relevant past medical, surgical, family, and social history reviewed and updated as indicated.  Allergies and medications reviewed and updated. Data reviewed: Chart in Epic.   Past Medical History:  Diagnosis Date   Anemia    GERD (gastroesophageal reflux disease)     Past Surgical History:  Procedure Laterality Date   NO PAST SURGERIES     ORIF ANKLE FRACTURE Right 08/23/2022   Procedure: OPEN REDUCTION INTERNAL FIXATION RIGHT ANKLE FRACTURE;  Surgeon: Timothy Ford, MD;  Location: [facility] OR;  Service: Orthopedics;  Laterality: Right;    Social History   Socioeconomic History   Marital status: Single    Spouse name: Not on file   Number of children: Not on file   Years of education: Not on file   Highest  education level: Not on file  Occupational History   Not on file  Tobacco Use   Smoking status: Never   Smokeless tobacco: Never  Vaping Use   Vaping status: Never Used  Substance and Sexual Activity   Alcohol use: No   Drug use: No   Sexual activity: Not on file  Other Topics Concern   Not on file  Social History Narrative   Not on file   Social Drivers of Health   Financial Resource Strain: Not on file  Food Insecurity: Not on file  Transportation Needs: Not on file  Physical Activity: Not on file  Stress: Not on file  Social Connections: Not on file  Intimate Partner Violence: Not on file    Outpatient Encounter Medications as of 01/07/2024  Medication Sig   amoxicillin -clavulanate (AUGMENTIN ) 875-125 MG tablet Take 1 tablet by mouth 2 (two) times daily for 7 days.   fluticasone  (FLONASE ) 50 MCG/ACT nasal spray Place 2 sprays into both nostrils daily.   loratadine (CLARITIN) 10 MG tablet Take 1 tablet (10 mg total) by mouth daily.   No facility-administered encounter medications on file as of 01/07/2024.    No Known Allergies  Pertinent ROS per HPI, otherwise unremarkable      Objective:  BP 112/77   Pulse 75   Temp (!) 97.2 F (36.2 C)   Ht 5\' 3"  (1.6 m)   Wt 160 lb 6.4 oz (72.8 kg)  SpO2 99%   BMI 28.41 kg/m    Wt Readings from Last 3 Encounters:  01/07/24 160 lb 6.4 oz (72.8 kg)  10/02/23 161 lb (73 kg)  09/27/22 143 lb 6.4 oz (65 kg)    Physical Exam Vitals and nursing note reviewed.  Constitutional:      Appearance: Normal appearance. She is well-developed, well-groomed and overweight.  HENT:     Head: Normocephalic and atraumatic.     Jaw: There is normal jaw occlusion.     Right Ear: Hearing, ear canal and external ear normal. A middle ear effusion is present. Tympanic membrane is not erythematous.     Left Ear: Hearing, ear canal and external ear normal. A middle ear effusion is present. Tympanic membrane is not erythematous.     Nose:  Congestion present.     Right Turbinates: Enlarged.     Left Turbinates: Enlarged.     Right Sinus: Frontal sinus tenderness present. No maxillary sinus tenderness.     Left Sinus: Frontal sinus tenderness present. No maxillary sinus tenderness.     Mouth/Throat:     Lips: Pink.     Mouth: Mucous membranes are moist.     Pharynx: Posterior oropharyngeal erythema and postnasal drip present. No pharyngeal swelling, oropharyngeal exudate or uvula swelling.  Eyes:     Conjunctiva/sclera: Conjunctivae normal.     Pupils: Pupils are equal, round, and reactive to light.  Cardiovascular:     Rate and Rhythm: Normal rate and regular rhythm.     Heart sounds: Normal heart sounds.  Pulmonary:     Effort: Pulmonary effort is normal.     Breath sounds: Normal breath sounds.  Musculoskeletal:     Cervical back: Neck supple.     Right lower leg: No edema.     Left lower leg: No edema.  Lymphadenopathy:     Cervical: No cervical adenopathy.  Skin:    General: Skin is warm and dry.     Capillary Refill: Capillary refill takes less than 2 seconds.  Neurological:     General: No focal deficit present.     Mental Status: She is alert and oriented to person, place, and time.     Cranial Nerves: No cranial nerve deficit.     Sensory: No sensory deficit.     Motor: No weakness.     Coordination: Coordination normal.     Gait: Gait normal.     Deep Tendon Reflexes: Reflexes normal.  Psychiatric:        Mood and Affect: Mood normal.        Behavior: Behavior normal. Behavior is cooperative.        Thought Content: Thought content normal.        Judgment: Judgment normal.     Results for orders placed or performed in visit on 09/27/22  Rapid Strep Screen ([facility] ONLY)   Collection Time: 09/27/22 11:27 AM   Specimen: Other   Other  Result Value Ref Range   Strep Gp A Ag, IA W/Reflex Positive (A) Negative  Veritor Flu A/B Waived   Collection Time: 09/27/22 11:27 AM  Result Value Ref  Range   Influenza A Negative Negative   Influenza B Negative Negative  Novel Coronavirus, NAA ([facility])   Collection Time: 09/27/22 11:36 AM   Specimen: Nasopharyngeal(NP) swabs in vial transport medium  Result Value Ref Range   SARS-CoV-2, NAA Not Detected Not Detected       Pertinent labs & imaging results  that were available during my care of the patient were reviewed by me and considered in my medical decision making.  Assessment & Plan:  Ashlynn was seen today for headache, cough and nasal congestion.  Diagnoses and all orders for this visit:  Acute non-recurrent frontal sinusitis -     loratadine (CLARITIN) 10 MG tablet; Take 1 tablet (10 mg total) by mouth daily. -     fluticasone  (FLONASE ) 50 MCG/ACT nasal spray; Place 2 sprays into both nostrils daily. -     amoxicillin -clavulanate (AUGMENTIN ) 875-125 MG tablet; Take 1 tablet by mouth 2 (two) times daily for 7 days.      Frontal sinusitis Frontal sinusitis with onset last Tuesday, presenting with fever (resolved by Thursday), persistent congestion, and frontal pressure. Greenish nasal discharge with blood suggests bacterial infection, likely exacerbated by allergies and pollen exposure. - Prescribe Augmentin  875 mg orally twice daily for seven days. - Prescribe Flonase  50 mcg/actuation, two sprays in each nostril every morning. - Prescribe Claritin 10 mg orally every night. - Advise increased hydration to thin nasal secretions. - Instruct to report any worsening or new symptoms.          Continue all other maintenance medications.  Follow up plan: Return if symptoms worsen or fail to improve.   Continue healthy lifestyle choices, including diet (rich in fruits, vegetables, and lean proteins, and low in salt and simple carbohydrates) and exercise (at least 30 minutes of moderate physical activity daily).  Educational handout given for sinusitis   The above assessment and management plan was discussed with the  patient. The patient verbalized understanding of and has agreed to the management plan. Patient is aware to call the clinic if they develop any new symptoms or if symptoms persist or worsen. Patient is aware when to return to the clinic for a follow-up visit. Patient educated on when it is appropriate to go to the emergency department.   Kattie Parrot, FNP-C [facility] [phone]

## 2024-02-21 IMAGING — DX DG FOOT COMPLETE 3+V*R*
3 series · 3 of 3 positions shown · non-contrast
Comparison: X-ray 07/09/2016.

CLINICAL DATA: Right foot pain.

EXAM:
RIGHT FOOT COMPLETE - 3+ VIEW

[foot ap]
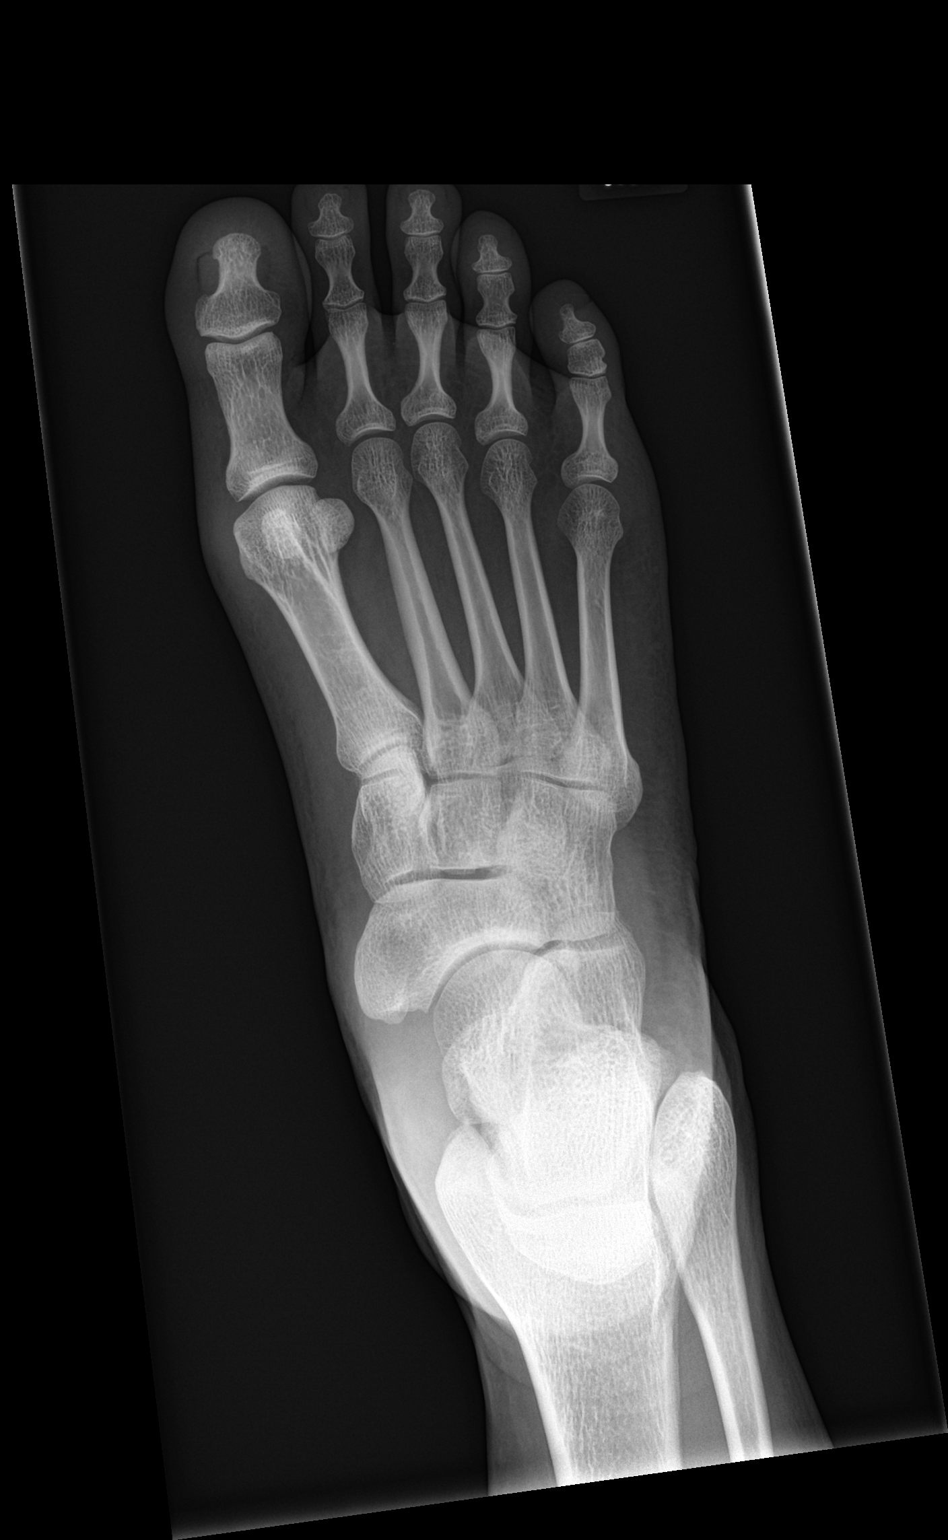

[foot obl]
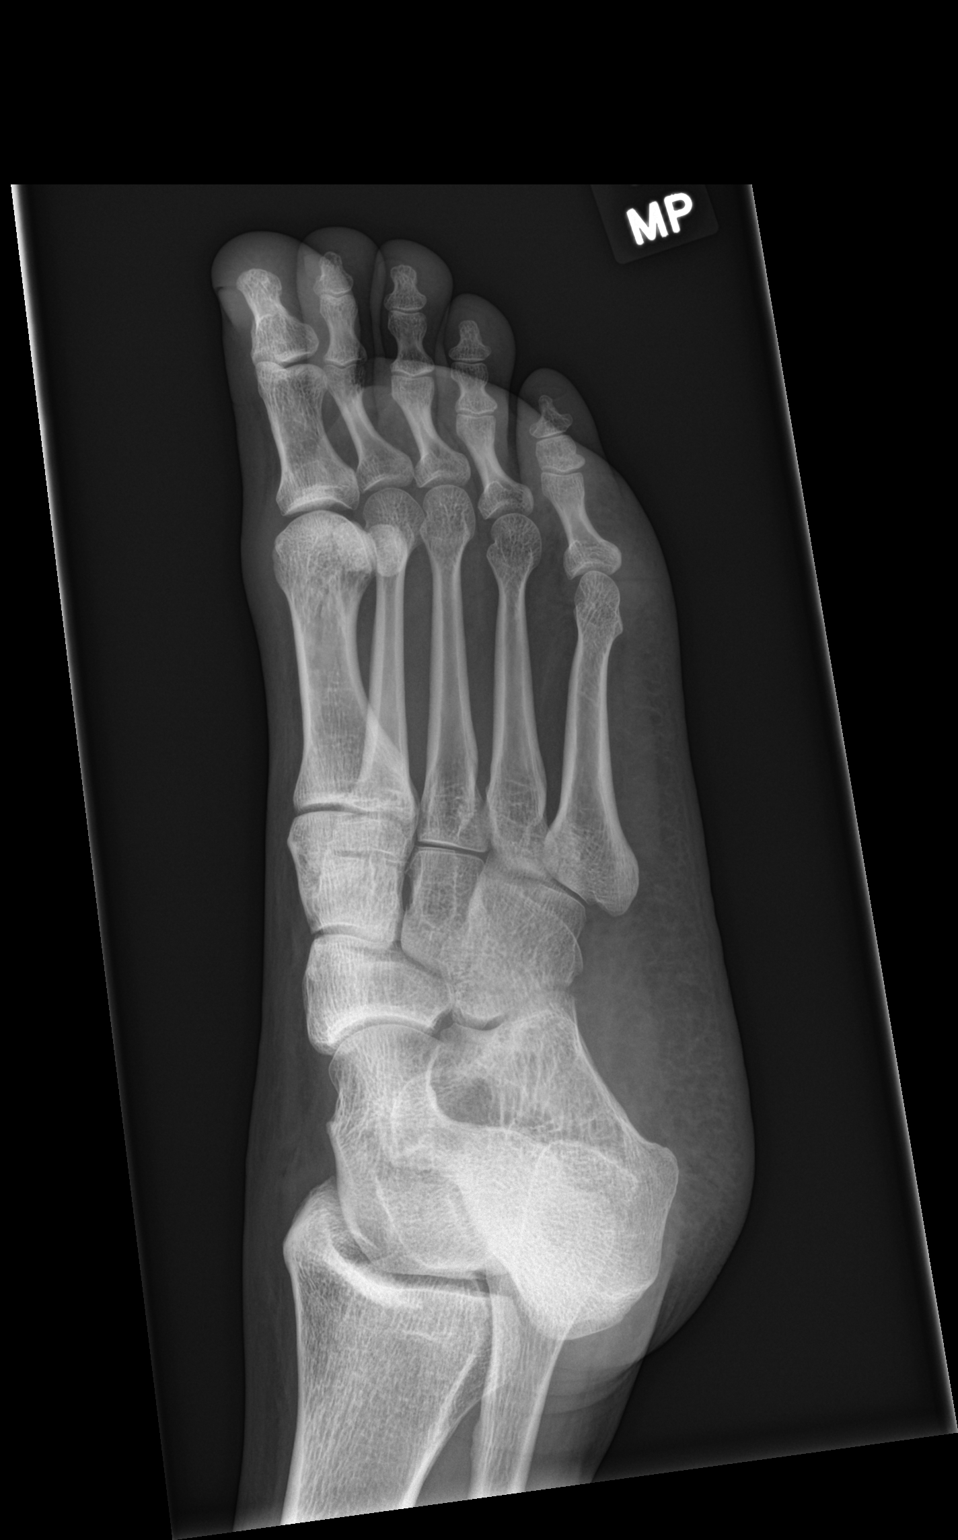

[foot lat]
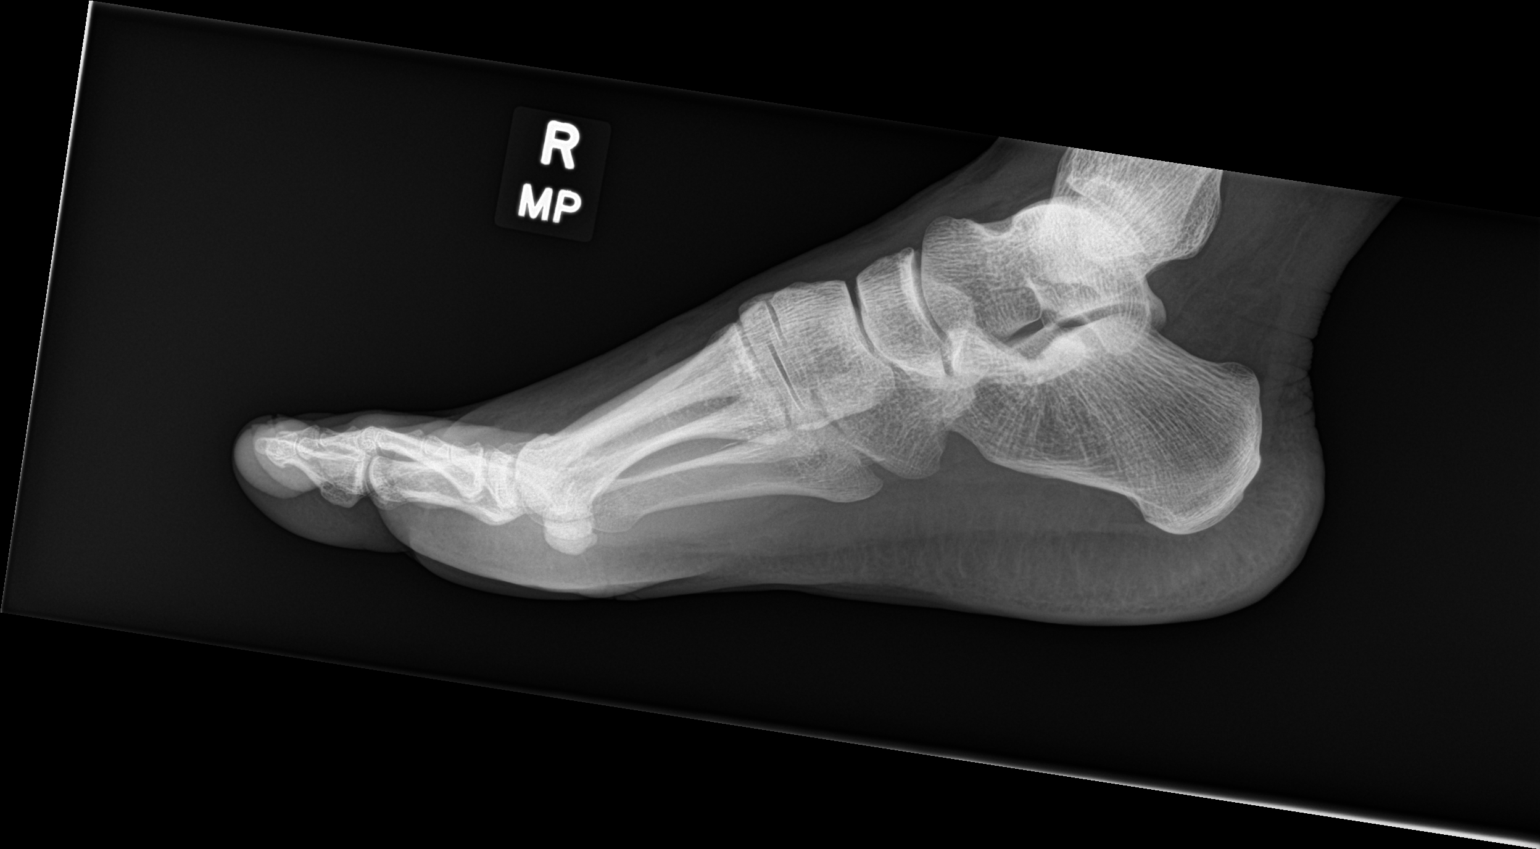

[3 of 3 positions shown; findings below may reference images not displayed]

FINDINGS: There is no evidence of fracture or dislocation. There is no
evidence of arthropathy or other focal bone abnormality. Soft
tissues are unremarkable.
IMPRESSION: Negative.

## 2024-04-08 ENCOUNTER — Ambulatory Visit: Admitting: Nurse Practitioner

## 2024-04-08 ENCOUNTER — Encounter: Payer: Self-pay | Admitting: Nurse Practitioner

## 2024-04-08 VITALS — BP 107/78 | HR 62 | Temp 97.6°F | Ht 63.0 in | Wt 161.0 lb

## 2024-04-08 DIAGNOSIS — H60502 Unspecified acute noninfective otitis externa, left ear: Secondary | ICD-10-CM

## 2024-04-08 MED ORDER — CIPROFLOXACIN-DEXAMETHASONE 0.3-0.1 % OT SUSP: 4.0000 [drp] | Freq: Two times a day (BID) | OTIC | 0 refills | Status: AC
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
## 2024-04-08 NOTE — Patient Instructions (Signed)
Otitis Externa  Otitis externa is an infection of the outer ear canal. The outer ear canal is the area between the outside of the ear and the eardrum. Otitis externa is sometimes called swimmer's ear. What are the causes? Common causes of this condition include: Swimming in dirty water. Moisture in the ear. An injury to the inside of the ear. An object stuck in the ear. A cut or scrape on the outside of the ear or in the ear canal. What increases the risk? You are more likely to get this condition if you go swimming often. What are the signs or symptoms? Itching in the ear. This is often the first symptom. Swelling of the ear. Redness in the ear. Ear pain. The pain may get worse when you pull on your ear. Pus coming from the ear. How is this treated? This condition may be treated with: Antibiotic ear drops. These are often given for 10-14 days. Medicines to reduce itching and swelling. Follow these instructions at home: If you were prescribed antibiotic ear drops, use them as told by your doctor. Do not stop using them even if you start to feel better. Take over-the-counter and prescription medicines only as told by your doctor. Avoid getting water in your ears as told by your doctor. You may be told to avoid swimming or water sports for a few days. Keep all follow-up visits. How is this prevented? Keep your ears dry. Use the corner of a towel to dry your ears after you swim or bathe. Try not to scratch or put things in your ear. Doing these things makes it easier for germs to grow in your ear. Avoid swimming in lakes, dirty water, or swimming pools that may not have the right amount of a chemical called chlorine. Contact a doctor if: You have a fever. Your ear is still red, swollen, or painful after 3 days. You still have pus coming from your ear after 3 days. Your redness, swelling, or pain gets worse. You have a very bad headache. Get help right away if: You have redness,  swelling, and pain or tenderness behind your ear. Summary Otitis externa is an infection of the outer ear canal. Symptoms include pain, redness, and swelling of the ear. If you were prescribed antibiotic ear drops, use them as told by your doctor. Do not stop using them even if you start to feel better. Try not to scratch or put things in your ear. This information is not intended to replace advice given to you by your health care provider. Make sure you discuss any questions you have with your health care provider. Document Revised: 11/08/2020 Document Reviewed: 11/08/2020 [employer] Patient Education  2024 [employer]  

## 2024-07-12 ENCOUNTER — Encounter: Payer: Self-pay | Admitting: Radiology
